# Patient Record
Sex: Female | Born: 1935 | Race: Black or African American | Hispanic: No | Marital: Married
Health system: Southern US, Community
[De-identification: ages and names within clinical notes are randomized; demographics above are authoritative.]

## PROBLEM LIST (undated history)

## (undated) DIAGNOSIS — E039 Hypothyroidism, unspecified: Secondary | ICD-10-CM

## (undated) DIAGNOSIS — I10 Essential (primary) hypertension: Secondary | ICD-10-CM

## (undated) DIAGNOSIS — C801 Malignant (primary) neoplasm, unspecified: Secondary | ICD-10-CM

## (undated) DIAGNOSIS — K219 Gastro-esophageal reflux disease without esophagitis: Secondary | ICD-10-CM

## (undated) HISTORY — PX: CHOLECYSTECTOMY: SHX55

## (undated) HISTORY — DX: Hypothyroidism, unspecified: E03.9

## (undated) HISTORY — DX: Essential (primary) hypertension: I10

## (undated) HISTORY — PX: HERNIA REPAIR: SHX51

## (undated) HISTORY — PX: KNEE SURGERY: SHX244

## (undated) HISTORY — DX: Gastro-esophageal reflux disease without esophagitis: K21.9

---

## 2006-05-11 ENCOUNTER — Encounter: Payer: Self-pay | Admitting: Orthopedic Surgery

## 2006-05-17 ENCOUNTER — Encounter: Payer: Self-pay | Admitting: Orthopedic Surgery

## 2006-06-16 ENCOUNTER — Encounter: Payer: Self-pay | Admitting: Orthopedic Surgery

## 2008-12-24 ENCOUNTER — Encounter: Payer: Self-pay | Admitting: Orthopedic Surgery

## 2009-01-15 ENCOUNTER — Encounter: Payer: Self-pay | Admitting: Orthopedic Surgery

## 2010-11-14 DIAGNOSIS — E039 Hypothyroidism, unspecified: Secondary | ICD-10-CM | POA: Insufficient documentation

## 2010-11-14 DIAGNOSIS — K449 Diaphragmatic hernia without obstruction or gangrene: Secondary | ICD-10-CM | POA: Insufficient documentation

## 2010-11-14 DIAGNOSIS — E669 Obesity, unspecified: Secondary | ICD-10-CM | POA: Insufficient documentation

## 2010-11-14 DIAGNOSIS — I10 Essential (primary) hypertension: Secondary | ICD-10-CM | POA: Insufficient documentation

## 2010-11-14 DIAGNOSIS — K219 Gastro-esophageal reflux disease without esophagitis: Secondary | ICD-10-CM | POA: Insufficient documentation

## 2010-11-24 ENCOUNTER — Ambulatory Visit: Payer: Self-pay | Admitting: Oncology

## 2010-11-25 ENCOUNTER — Ambulatory Visit: Payer: Self-pay | Admitting: Oncology

## 2011-08-01 DIAGNOSIS — S02411A LeFort I fracture, initial encounter for closed fracture: Secondary | ICD-10-CM | POA: Insufficient documentation

## 2012-01-25 ENCOUNTER — Ambulatory Visit: Payer: Self-pay | Admitting: Oncology

## 2012-02-21 ENCOUNTER — Ambulatory Visit: Payer: Self-pay | Admitting: Oncology

## 2012-02-22 LAB — COMPREHENSIVE METABOLIC PANEL
Alkaline Phosphatase: 89 U/L (ref 50–136)
Bilirubin,Total: 1.6 mg/dL — ABNORMAL HIGH (ref 0.2–1.0)
Calcium, Total: 9.7 mg/dL (ref 8.5–10.1)
Chloride: 102 mmol/L (ref 98–107)
Co2: 33 mmol/L — ABNORMAL HIGH (ref 21–32)
EGFR (African American): >60
EGFR (Non-African Amer.): 53 — ABNORMAL LOW
Glucose: 79 mg/dL (ref 65–99)
Potassium: 3.4 mmol/L — ABNORMAL LOW (ref 3.5–5.1)
SGOT(AST): 17 U/L (ref 15–37)
SGPT (ALT): 14 U/L (ref 12–78)
Sodium: 139 mmol/L (ref 136–145)
Total Protein: 7.9 g/dL (ref 6.4–8.2)

## 2012-02-22 LAB — CBC CANCER CENTER
Basophil #: 0 x10 3/mm (ref 0.0–0.1)
Basophil %: 0.3 %
HCT: 38.5 % (ref 35.0–47.0)
HGB: 13.6 g/dL (ref 12.0–16.0)
Lymphocyte #: 1.4 x10 3/mm (ref 1.0–3.6)
MCH: 31.4 pg (ref 26.0–34.0)
MCHC: 35.3 g/dL (ref 32.0–36.0)
MCV: 89 fL (ref 80–100)
Monocyte #: 0.4 x10 3/mm (ref 0.2–0.9)
Monocyte %: 9.6 %
Neutrophil #: 2.6 x10 3/mm (ref 1.4–6.5)
Neutrophil %: 56.7 %
Platelet: 110 x10 3/mm — ABNORMAL LOW (ref 150–440)
RBC: 4.32 10*6/uL (ref 3.80–5.20)
RDW: 16.3 % — ABNORMAL HIGH (ref 11.5–14.5)
WBC: 4.6 x10 3/mm (ref 3.6–11.0)

## 2012-02-22 LAB — MAGNESIUM: Magnesium: 2.1 mg/dL

## 2012-03-18 ENCOUNTER — Ambulatory Visit: Payer: Self-pay | Admitting: Oncology

## 2012-04-15 ENCOUNTER — Ambulatory Visit: Payer: Self-pay | Admitting: Oncology

## 2012-09-06 ENCOUNTER — Ambulatory Visit: Payer: Self-pay | Admitting: Oncology

## 2012-10-05 ENCOUNTER — Ambulatory Visit: Payer: Self-pay | Admitting: Oncology

## 2012-10-06 LAB — COMPREHENSIVE METABOLIC PANEL
Anion Gap: 8 (ref 7–16)
BUN: 7 mg/dL (ref 7–18)
Bilirubin,Total: 1.9 mg/dL — ABNORMAL HIGH (ref 0.2–1.0)
Calcium, Total: 10 mg/dL (ref 8.5–10.1)
Creatinine: 0.81 mg/dL (ref 0.60–1.30)
EGFR (African American): >60
EGFR (Non-African Amer.): >60
Glucose: 118 mg/dL — ABNORMAL HIGH (ref 65–99)
Osmolality: 277 (ref 275–301)
Potassium: 3 mmol/L — ABNORMAL LOW (ref 3.5–5.1)
SGOT(AST): 12 U/L — ABNORMAL LOW (ref 15–37)
Sodium: 139 mmol/L (ref 136–145)

## 2012-10-06 LAB — CBC CANCER CENTER
Basophil #: 0 x10 3/mm (ref 0.0–0.1)
Eosinophil %: 0.7 %
HCT: 36.6 % (ref 35.0–47.0)
HGB: 13 g/dL (ref 12.0–16.0)
Lymphocyte %: 20 %
MCH: 30.9 pg (ref 26.0–34.0)
Monocyte #: 0.1 x10 3/mm — ABNORMAL LOW (ref 0.2–0.9)
Neutrophil %: 75.9 %
RBC: 4.2 10*6/uL (ref 3.80–5.20)
RDW: 17.2 % — ABNORMAL HIGH (ref 11.5–14.5)
WBC: 2.9 x10 3/mm — ABNORMAL LOW (ref 3.6–11.0)

## 2012-10-16 ENCOUNTER — Ambulatory Visit: Payer: Self-pay | Admitting: Oncology

## 2012-11-15 ENCOUNTER — Ambulatory Visit: Payer: Self-pay | Admitting: Oncology

## 2012-12-16 ENCOUNTER — Ambulatory Visit: Payer: Self-pay | Admitting: Oncology

## 2013-01-15 ENCOUNTER — Ambulatory Visit: Payer: Self-pay | Admitting: Oncology

## 2013-02-15 ENCOUNTER — Ambulatory Visit: Payer: Self-pay | Admitting: Oncology

## 2013-02-26 LAB — BASIC METABOLIC PANEL
ANION GAP: 10 (ref 7–16)
BUN: 14 mg/dL (ref 7–18)
CALCIUM: 10 mg/dL (ref 8.5–10.1)
CREATININE: 0.9 mg/dL (ref 0.60–1.30)
Chloride: 101 mmol/L (ref 98–107)
Co2: 29 mmol/L (ref 21–32)
Glucose: 159 mg/dL — ABNORMAL HIGH (ref 65–99)
OSMOLALITY: 283 (ref 275–301)
POTASSIUM: 3.2 mmol/L — AB (ref 3.5–5.1)
Sodium: 140 mmol/L (ref 136–145)

## 2013-02-26 LAB — CBC CANCER CENTER
BASOS PCT: 0.4 %
Basophil #: 0 x10 3/mm (ref 0.0–0.1)
EOS PCT: 0 %
Eosinophil #: 0 x10 3/mm (ref 0.0–0.7)
HCT: 41.1 % (ref 35.0–47.0)
HGB: 13.4 g/dL (ref 12.0–16.0)
Lymphocyte #: 0.6 x10 3/mm — ABNORMAL LOW (ref 1.0–3.6)
Lymphocyte %: 20.1 %
MCH: 28.5 pg (ref 26.0–34.0)
MCHC: 32.5 g/dL (ref 32.0–36.0)
MCV: 88 fL (ref 80–100)
MONO ABS: 0 x10 3/mm — AB (ref 0.2–0.9)
MONOS PCT: 1.3 %
NEUTROS ABS: 2.5 x10 3/mm (ref 1.4–6.5)
NEUTROS PCT: 78.2 %
Platelet: 107 x10 3/mm — ABNORMAL LOW (ref 150–440)
RBC: 4.69 10*6/uL (ref 3.80–5.20)
RDW: 16.6 % — ABNORMAL HIGH (ref 11.5–14.5)
WBC: 3.2 x10 3/mm — ABNORMAL LOW (ref 3.6–11.0)

## 2013-03-18 ENCOUNTER — Ambulatory Visit: Payer: Self-pay

## 2013-03-18 ENCOUNTER — Ambulatory Visit: Payer: Self-pay | Admitting: Oncology

## 2013-04-18 ENCOUNTER — Ambulatory Visit: Payer: Self-pay | Admitting: Oncology

## 2013-05-16 ENCOUNTER — Ambulatory Visit: Payer: Self-pay | Admitting: Oncology

## 2013-06-15 ENCOUNTER — Ambulatory Visit: Payer: Self-pay | Admitting: Oncology

## 2013-07-04 ENCOUNTER — Ambulatory Visit: Payer: Self-pay | Admitting: Oncology

## 2013-08-03 ENCOUNTER — Ambulatory Visit: Payer: Self-pay | Admitting: Oncology

## 2013-08-15 ENCOUNTER — Ambulatory Visit: Payer: Self-pay | Admitting: Family Medicine

## 2013-08-15 ENCOUNTER — Ambulatory Visit: Payer: Self-pay | Admitting: Oncology

## 2013-09-04 LAB — CBC CANCER CENTER
BASOS ABS: 0 x10 3/mm (ref 0.0–0.1)
Basophil %: 0.4 %
EOS ABS: 0 x10 3/mm (ref 0.0–0.7)
Eosinophil %: 0.1 %
HCT: 42.2 % (ref 35.0–47.0)
HGB: 13.7 g/dL (ref 12.0–16.0)
LYMPHS PCT: 23.1 %
Lymphocyte #: 0.6 x10 3/mm — ABNORMAL LOW (ref 1.0–3.6)
MCH: 29.2 pg (ref 26.0–34.0)
MCHC: 32.4 g/dL (ref 32.0–36.0)
MCV: 90 fL (ref 80–100)
MONO ABS: 0 x10 3/mm — AB (ref 0.2–0.9)
Monocyte %: 1.6 %
NEUTROS ABS: 1.9 x10 3/mm (ref 1.4–6.5)
NEUTROS PCT: 74.8 %
Platelet: 86 x10 3/mm — ABNORMAL LOW (ref 150–440)
RBC: 4.69 10*6/uL (ref 3.80–5.20)
RDW: 17 % — ABNORMAL HIGH (ref 11.5–14.5)
WBC: 2.6 x10 3/mm — AB (ref 3.6–11.0)

## 2013-09-04 LAB — COMPREHENSIVE METABOLIC PANEL
ALT: 10 U/L — AB (ref 12–78)
ANION GAP: 7 (ref 7–16)
Albumin: 3.8 g/dL (ref 3.4–5.0)
Alkaline Phosphatase: 67 U/L
BUN: 9 mg/dL (ref 7–18)
Bilirubin,Total: 1.8 mg/dL — ABNORMAL HIGH (ref 0.2–1.0)
Calcium, Total: 9.9 mg/dL (ref 8.5–10.1)
Chloride: 103 mmol/L (ref 98–107)
Co2: 32 mmol/L (ref 21–32)
Creatinine: 0.95 mg/dL (ref 0.60–1.30)
EGFR (Non-African Amer.): 57 — ABNORMAL LOW
Glucose: 155 mg/dL — ABNORMAL HIGH (ref 65–99)
Osmolality: 285 (ref 275–301)
POTASSIUM: 3.6 mmol/L (ref 3.5–5.1)
SGOT(AST): 11 U/L — ABNORMAL LOW (ref 15–37)
Sodium: 142 mmol/L (ref 136–145)
TOTAL PROTEIN: 8.2 g/dL (ref 6.4–8.2)

## 2013-09-15 ENCOUNTER — Ambulatory Visit: Payer: Self-pay | Admitting: Family Medicine

## 2013-09-15 ENCOUNTER — Ambulatory Visit: Payer: Self-pay | Admitting: Oncology

## 2013-10-16 ENCOUNTER — Ambulatory Visit: Payer: Self-pay | Admitting: Oncology

## 2013-11-15 ENCOUNTER — Ambulatory Visit: Payer: Self-pay | Admitting: Oncology

## 2013-12-16 ENCOUNTER — Ambulatory Visit: Payer: Self-pay | Admitting: Oncology

## 2014-03-15 ENCOUNTER — Ambulatory Visit: Payer: Self-pay | Admitting: Oncology

## 2014-03-15 LAB — CBC CANCER CENTER
BASOS ABS: 0 x10 3/mm (ref 0.0–0.1)
Basophil %: 0.4 %
Eosinophil #: 0.1 x10 3/mm (ref 0.0–0.7)
Eosinophil %: 2.2 %
HCT: 35.7 % (ref 35.0–47.0)
HGB: 11.7 g/dL — AB (ref 12.0–16.0)
LYMPHS PCT: 39.1 %
Lymphocyte #: 1.4 x10 3/mm (ref 1.0–3.6)
MCH: 31.6 pg (ref 26.0–34.0)
MCHC: 32.8 g/dL (ref 32.0–36.0)
MCV: 96 fL (ref 80–100)
MONOS PCT: 13.3 %
Monocyte #: 0.5 x10 3/mm (ref 0.2–0.9)
Neutrophil #: 1.6 x10 3/mm (ref 1.4–6.5)
Neutrophil %: 45 %
Platelet: 135 x10 3/mm — ABNORMAL LOW (ref 150–440)
RBC: 3.71 10*6/uL — ABNORMAL LOW (ref 3.80–5.20)
RDW: 17.1 % — AB (ref 11.5–14.5)
WBC: 3.6 x10 3/mm (ref 3.6–11.0)

## 2014-03-15 LAB — BASIC METABOLIC PANEL
Anion Gap: 8 (ref 7–16)
BUN: 9 mg/dL (ref 7–18)
Calcium, Total: 9.5 mg/dL (ref 8.5–10.1)
Chloride: 106 mmol/L (ref 98–107)
Co2: 28 mmol/L (ref 21–32)
Creatinine: 0.94 mg/dL (ref 0.60–1.30)
GLUCOSE: 96 mg/dL (ref 65–99)
OSMOLALITY: 282 (ref 275–301)
Potassium: 3.8 mmol/L (ref 3.5–5.1)
SODIUM: 142 mmol/L (ref 136–145)

## 2014-03-18 ENCOUNTER — Ambulatory Visit: Payer: Self-pay | Admitting: Oncology

## 2014-04-16 ENCOUNTER — Ambulatory Visit
Admit: 2014-04-16 | Disposition: A | Discharge status: Home / Self Care | Payer: Self-pay | Attending: Oncology | Admitting: Oncology

## 2014-05-24 ENCOUNTER — Ambulatory Visit
Admit: 2014-05-24 | Disposition: A | Discharge status: Home / Self Care | Payer: Self-pay | Attending: Oncology | Admitting: Oncology

## 2014-05-31 ENCOUNTER — Other Ambulatory Visit: Payer: Self-pay | Admitting: Oncology

## 2014-05-31 DIAGNOSIS — D3A00 Benign carcinoid tumor of unspecified site: Secondary | ICD-10-CM

## 2014-06-21 ENCOUNTER — Inpatient Hospital Stay: Payer: Medicare Other | Attending: Oncology

## 2014-06-21 VITALS — BP 132/72 | HR 84 | Temp 98.0°F

## 2014-06-21 DIAGNOSIS — Z79899 Other long term (current) drug therapy: Secondary | ICD-10-CM | POA: Diagnosis not present

## 2014-06-21 DIAGNOSIS — C7B09 Secondary carcinoid tumors of other sites: Secondary | ICD-10-CM | POA: Insufficient documentation

## 2014-06-21 DIAGNOSIS — C7A00 Malignant carcinoid tumor of unspecified site: Secondary | ICD-10-CM | POA: Insufficient documentation

## 2014-06-21 DIAGNOSIS — C7B02 Secondary carcinoid tumors of liver: Secondary | ICD-10-CM | POA: Diagnosis not present

## 2014-06-21 MED ORDER — OCTREOTIDE ACETATE 20 MG IM KIT
20.0000 mg | PACK | INTRAMUSCULAR | Status: DC
  Administered 2014-06-21: 20 mg via INTRAMUSCULAR
  Filled 2014-06-21 (×2): qty 1

## 2014-06-21 MED ORDER — OCTREOTIDE ACETATE 30 MG IM KIT: 30.0000 mg | PACK | Freq: Once | INTRAMUSCULAR | Status: DC

## 2014-07-19 ENCOUNTER — Inpatient Hospital Stay: Payer: Medicare Other | Attending: Oncology

## 2014-07-19 DIAGNOSIS — Z79899 Other long term (current) drug therapy: Secondary | ICD-10-CM | POA: Insufficient documentation

## 2014-07-19 DIAGNOSIS — C7A00 Malignant carcinoid tumor of unspecified site: Secondary | ICD-10-CM

## 2014-07-19 DIAGNOSIS — C7B02 Secondary carcinoid tumors of liver: Secondary | ICD-10-CM | POA: Diagnosis present

## 2014-07-19 MED ORDER — OCTREOTIDE ACETATE 30 MG IM KIT
30.0000 mg | PACK | Freq: Once | INTRAMUSCULAR | Status: AC
  Administered 2014-07-19: 30 mg via INTRAMUSCULAR
  Filled 2014-07-19: qty 1

## 2014-08-16 ENCOUNTER — Inpatient Hospital Stay: Payer: Medicare Other | Attending: Oncology

## 2014-08-16 DIAGNOSIS — C7B02 Secondary carcinoid tumors of liver: Secondary | ICD-10-CM | POA: Insufficient documentation

## 2014-08-16 DIAGNOSIS — Z79899 Other long term (current) drug therapy: Secondary | ICD-10-CM | POA: Insufficient documentation

## 2014-08-16 DIAGNOSIS — C7A00 Malignant carcinoid tumor of unspecified site: Secondary | ICD-10-CM

## 2014-08-16 MED ORDER — OCTREOTIDE ACETATE 30 MG IM KIT
30.0000 mg | PACK | Freq: Once | INTRAMUSCULAR | Status: AC
  Administered 2014-08-16: 30 mg via INTRAMUSCULAR
  Filled 2014-08-16: qty 1

## 2014-09-13 ENCOUNTER — Inpatient Hospital Stay: Payer: Medicare Other

## 2014-09-20 ENCOUNTER — Other Ambulatory Visit: Payer: Self-pay | Admitting: Oncology

## 2014-09-24 ENCOUNTER — Other Ambulatory Visit: Payer: Self-pay | Admitting: Oncology

## 2014-09-24 DIAGNOSIS — C7A00 Malignant carcinoid tumor of unspecified site: Secondary | ICD-10-CM

## 2014-09-25 ENCOUNTER — Telehealth: Payer: Self-pay | Admitting: Oncology

## 2014-09-25 NOTE — Telephone Encounter (Signed)
I will fax prescription to patients pharmacy.

## 2014-09-25 NOTE — Telephone Encounter (Signed)
She has an allergy to IV dye and is getting a CT on 10/08/14. The instructions we received from CT were for her to take: 50mg  prednisone tablet, 13 hrs prior, 7 hrs prior, and 1 hr prior, and 50 mg Benadryl 1 hr prior for allergy to IV dye. We were also told to ask Woodfin Ganja to please write the prescriptions for her to get this. Thanks!

## 2014-09-27 ENCOUNTER — Other Ambulatory Visit: Payer: Self-pay

## 2014-09-27 ENCOUNTER — Ambulatory Visit: Payer: Self-pay | Admitting: Oncology

## 2014-10-08 ENCOUNTER — Inpatient Hospital Stay: Payer: Medicare Other | Attending: Oncology

## 2014-10-08 ENCOUNTER — Ambulatory Visit
Admission: RE | Admit: 2014-10-08 | Discharge: 2014-10-08 | Disposition: A | Discharge status: Home / Self Care | Payer: Medicare Other | Source: Ambulatory Visit | Attending: Oncology | Admitting: Oncology

## 2014-10-08 ENCOUNTER — Encounter: Payer: Medicare Other | Admitting: Family Medicine

## 2014-10-08 ENCOUNTER — Other Ambulatory Visit: Payer: Self-pay | Admitting: Family Medicine

## 2014-10-08 ENCOUNTER — Inpatient Hospital Stay: Payer: Medicare Other

## 2014-10-08 ENCOUNTER — Inpatient Hospital Stay: Payer: Medicare Other | Admitting: Family Medicine

## 2014-10-08 VITALS — BP 138/62 | HR 50 | Temp 96.3°F | Resp 20

## 2014-10-08 VITALS — BP 158/70 | HR 54 | Temp 96.3°F

## 2014-10-08 DIAGNOSIS — C7A00 Malignant carcinoid tumor of unspecified site: Secondary | ICD-10-CM

## 2014-10-08 DIAGNOSIS — Z79899 Other long term (current) drug therapy: Secondary | ICD-10-CM | POA: Insufficient documentation

## 2014-10-08 DIAGNOSIS — C7B09 Secondary carcinoid tumors of other sites: Secondary | ICD-10-CM | POA: Diagnosis not present

## 2014-10-08 DIAGNOSIS — C7B02 Secondary carcinoid tumors of liver: Secondary | ICD-10-CM | POA: Insufficient documentation

## 2014-10-08 LAB — CBC WITH DIFFERENTIAL/PLATELET
BASOS ABS: 0 10*3/uL (ref 0–0.1)
Eosinophils Absolute: 0.1 10*3/uL (ref 0–0.7)
HCT: 36.9 % (ref 35.0–47.0)
Hemoglobin: 12.3 g/dL (ref 12.0–16.0)
Lymphs Abs: 1.4 10*3/uL (ref 1.0–3.6)
MCH: 29.9 pg (ref 26.0–34.0)
MCHC: 33.4 g/dL (ref 32.0–36.0)
MCV: 89.6 fL (ref 80.0–100.0)
Monocytes Absolute: 0.4 10*3/uL (ref 0.2–0.9)
Monocytes Relative: 12 %
NEUTROS ABS: 1.4 10*3/uL (ref 1.4–6.5)
Neutrophils Relative %: 42 %
PLATELETS: 72 10*3/uL — AB (ref 150–440)
RBC: 4.11 MIL/uL (ref 3.80–5.20)
RDW: 17.5 % — ABNORMAL HIGH (ref 11.5–14.5)
WBC: 3.3 10*3/uL — AB (ref 3.6–11.0)

## 2014-10-08 LAB — COMPREHENSIVE METABOLIC PANEL WITH GFR
ALT: 8 U/L — ABNORMAL LOW (ref 14–54)
AST: 16 U/L (ref 15–41)
Albumin: 3.8 g/dL (ref 3.5–5.0)
Alkaline Phosphatase: 67 U/L (ref 38–126)
Anion gap: 7 (ref 5–15)
BUN: 8 mg/dL (ref 6–20)
CO2: 31 mmol/L (ref 22–32)
Calcium: 9.8 mg/dL (ref 8.9–10.3)
Chloride: 106 mmol/L (ref 101–111)
Creatinine, Ser: 0.73 mg/dL (ref 0.44–1.00)
GFR calc Af Amer: >60 mL/min
GFR calc non Af Amer: >60 mL/min
Glucose, Bld: 91 mg/dL (ref 65–99)
Potassium: 4.2 mmol/L (ref 3.5–5.1)
Sodium: 144 mmol/L (ref 135–145)
Total Bilirubin: 2.3 mg/dL — ABNORMAL HIGH (ref 0.3–1.2)
Total Protein: 7.2 g/dL (ref 6.5–8.1)

## 2014-10-08 MED ORDER — OCTREOTIDE ACETATE 30 MG IM KIT
30.0000 mg | PACK | Freq: Once | INTRAMUSCULAR | Status: AC
  Administered 2014-10-08: 30 mg via INTRAMUSCULAR

## 2014-10-08 MED ORDER — OCTREOTIDE ACETATE 30 MG IM KIT: 30.0000 mg | PACK | Freq: Once | INTRAMUSCULAR | Status: DC

## 2014-10-08 NOTE — Progress Notes (Signed)
Patient acute add on today for c/o dizziness.  Here initially as lab only.  Patient poor historian.  Unable to reconcile medication list. Magda Paganini, NP to evaluate once lab results are in.

## 2014-10-08 NOTE — Progress Notes (Signed)
Pt came to [facility] for her CT scan however pt never picked up contrast nor did she take the benadryl or prednisone so CT has been rescheduled. MD appt cancelled for this Friday. Pt missed her last sandostatin injection on July 29th. Decision made to give her 30mg  Octreotide injection today since she is currently in [place]. Pt states she is doing fairly well. Having some mild dizziness today. BP was good today. Pt is in a W/C. Friendly and talkative.

## 2014-10-09 ENCOUNTER — Other Ambulatory Visit: Payer: Medicare Other

## 2014-10-11 ENCOUNTER — Ambulatory Visit: Payer: Medicare Other | Admitting: Oncology

## 2014-10-11 ENCOUNTER — Other Ambulatory Visit: Payer: Medicare Other

## 2014-10-11 ENCOUNTER — Inpatient Hospital Stay: Payer: Medicare Other

## 2014-10-14 ENCOUNTER — Ambulatory Visit: Payer: Medicare Other | Admitting: Oncology

## 2014-10-14 ENCOUNTER — Other Ambulatory Visit: Payer: Medicare Other

## 2014-10-15 ENCOUNTER — Ambulatory Visit: Payer: Medicare Other

## 2014-10-15 ENCOUNTER — Ambulatory Visit
Admission: RE | Admit: 2014-10-15 | Discharge: 2014-10-15 | Disposition: A | Discharge status: Home / Self Care | Payer: Medicare Other | Source: Ambulatory Visit | Attending: Oncology | Admitting: Oncology

## 2014-10-15 DIAGNOSIS — C7A00 Malignant carcinoid tumor of unspecified site: Secondary | ICD-10-CM | POA: Diagnosis not present

## 2014-10-15 DIAGNOSIS — N200 Calculus of kidney: Secondary | ICD-10-CM | POA: Insufficient documentation

## 2014-10-15 DIAGNOSIS — K868 Other specified diseases of pancreas: Secondary | ICD-10-CM | POA: Insufficient documentation

## 2014-10-15 DIAGNOSIS — N281 Cyst of kidney, acquired: Secondary | ICD-10-CM | POA: Diagnosis not present

## 2014-10-15 MED ORDER — IOHEXOL 300 MG/ML  SOLN
100.0000 mL | Freq: Once | INTRAMUSCULAR | Status: AC | PRN
Start: 2014-10-15 — End: 2014-10-15
  Administered 2014-10-15: 100 mL via INTRAVENOUS

## 2014-10-18 ENCOUNTER — Inpatient Hospital Stay: Payer: Medicare Other | Attending: Oncology | Admitting: Oncology

## 2014-10-18 ENCOUNTER — Ambulatory Visit: Payer: Medicare Other

## 2014-10-18 ENCOUNTER — Inpatient Hospital Stay: Payer: Medicare Other

## 2014-10-18 ENCOUNTER — Encounter: Payer: Self-pay | Admitting: Oncology

## 2014-10-18 VITALS — BP 134/58 | HR 54 | Temp 96.1°F | Resp 16

## 2014-10-18 DIAGNOSIS — R5381 Other malaise: Secondary | ICD-10-CM | POA: Diagnosis not present

## 2014-10-18 DIAGNOSIS — R531 Weakness: Secondary | ICD-10-CM

## 2014-10-18 DIAGNOSIS — C7A00 Malignant carcinoid tumor of unspecified site: Secondary | ICD-10-CM | POA: Diagnosis not present

## 2014-10-18 DIAGNOSIS — K219 Gastro-esophageal reflux disease without esophagitis: Secondary | ICD-10-CM | POA: Insufficient documentation

## 2014-10-18 DIAGNOSIS — C7B02 Secondary carcinoid tumors of liver: Secondary | ICD-10-CM | POA: Diagnosis present

## 2014-10-18 DIAGNOSIS — R5383 Other fatigue: Secondary | ICD-10-CM | POA: Diagnosis not present

## 2014-10-18 DIAGNOSIS — E039 Hypothyroidism, unspecified: Secondary | ICD-10-CM | POA: Insufficient documentation

## 2014-10-18 DIAGNOSIS — R197 Diarrhea, unspecified: Secondary | ICD-10-CM

## 2014-10-18 DIAGNOSIS — C7B09 Secondary carcinoid tumors of other sites: Secondary | ICD-10-CM | POA: Diagnosis not present

## 2014-10-18 DIAGNOSIS — I10 Essential (primary) hypertension: Secondary | ICD-10-CM | POA: Insufficient documentation

## 2014-10-18 DIAGNOSIS — Z79899 Other long term (current) drug therapy: Secondary | ICD-10-CM

## 2014-10-21 NOTE — Progress Notes (Signed)
[place]  Telephone:(336) [phone] Fax:(336) [phone]  ID: Valerie Gray OB: [birth year]  MR#: [id number]  NLG#:[id number]  Patient Care Team: Renata Caprice, DO as PCP - General (Family Medicine)  CHIEF COMPLAINT: Metastatic carcinoid tumor with lesions in stomach and liver diagnosed in approximately October 2012  Chief Complaint  Patient presents with  . Follow-up    Malignant carcinoid tumor of unknown primary site/discuss test results    INTERVAL HISTORY: Patient returns to clinic today for further evaluation and discussion of her imaging results. She is complaining of increased diarrhea over the past several weeks. She continues to feel weak and fatigued. She does not complain of weight loss today. She also continues to have occasional abdominal pain. She denies any nausea, vomiting, or constipation. She denies any melena or hematochezia.  She has no neurologic complaints.  She denies any chest pain or shortness of breath.  Patient offers no further specific complaints.  REVIEW OF SYSTEMS:   Review of Systems  Constitutional: Positive for malaise/fatigue. Negative for weight loss.  Respiratory: Negative.   Cardiovascular: Negative.   Gastrointestinal: Positive for diarrhea.  Neurological: Positive for weakness.    As per HPI. Otherwise, a complete review of systems is negatve.  PAST MEDICAL HISTORY: Past Medical History  Diagnosis Date  . Hypothyroidism   . GERD (gastroesophageal reflux disease)   . Hypertension     PAST SURGICAL HISTORY: Past Surgical History  Procedure Laterality Date  . Cholecystectomy    . Hernia repair    . Knee surgery      FAMILY HISTORY No family history on file.     ADVANCED DIRECTIVES:    HEALTH MAINTENANCE: Social History  Substance Use Topics  . Smoking status: Not on file  . Smokeless tobacco: Not on file  . Alcohol Use: Not on file     Colonoscopy:  PAP:  Bone density:  Lipid panel:  Allergies    Allergen Reactions  . Iodinated Diagnostic Agents Hives, Itching and Rash    Current Outpatient Prescriptions  Medication Sig Dispense Refill  . cloNIDine (CATAPRES - DOSED IN MG/24 HR) 0.2 mg/24hr patch Place 0.2 mg onto the skin once a week.    . donepezil (ARICEPT) 10 MG tablet Take 10 mg by mouth at bedtime.    . furosemide (LASIX) 20 MG tablet Take 20 mg by mouth daily.    Marland Kitchen guaiFENesin 200 MG tablet Take 200 mg by mouth every 8 (eight) hours as needed for cough or to loosen phlegm.    Marland Kitchen levothyroxine (SYNTHROID, LEVOTHROID) 137 MCG tablet Take 137 mcg by mouth daily before breakfast.    . lisinopril (PRINIVIL,ZESTRIL) 10 MG tablet Take 10 mg by mouth daily.    Marland Kitchen loperamide (IMODIUM A-D) 2 MG tablet Take 2 mg by mouth 4 (four) times daily as needed for diarrhea or loose stools. 2 tabs after loose stools.  Max 3 doses in 24 hours    . metoprolol succinate (TOPROL-XL) 25 MG 24 hr tablet Take 25 mg by mouth 2 (two) times daily.    . potassium chloride (K-DUR) 10 MEQ tablet Take 10 mEq by mouth daily.    . potassium chloride SA (K-DUR,KLOR-CON) 20 MEQ tablet Take by mouth.     No current facility-administered medications for this visit.   Facility-Administered Medications Ordered in Other Visits  Medication Dose Route Frequency Provider Last Rate Last Dose  . octreotide (SANDOSTATIN LAR) IM injection 30 mg  30 mg Intramuscular Once Evlyn Kanner, NP  OBJECTIVE: Filed Vitals:   10/18/14 1110  BP: 134/58  Pulse: 54  Temp: 96.1 F (35.6 C)  Resp: 16     There is no height or weight on file to calculate BMI.    ECOG FS:1 - Symptomatic but completely ambulatory  General: Well-developed, well-nourished, no acute distress. Eyes: Pink conjunctiva, anicteric sclera. Lungs: Clear to auscultation bilaterally. Heart: Regular rate and rhythm. No rubs, murmurs, or gallops. Abdomen: Soft, nontender, nondistended. No organomegaly noted, normoactive bowel sounds. Musculoskeletal: No  edema, cyanosis, or clubbing. Neuro: Alert, answering all questions appropriately. Cranial nerves grossly intact. Skin: No rashes or petechiae noted. Psych: Normal affect.   LAB RESULTS:  Lab Results  Component Value Date   NA 144 10/08/2014   K 4.2 10/08/2014   CL 106 10/08/2014   CO2 31 10/08/2014   GLUCOSE 91 10/08/2014   BUN 8 10/08/2014   CREATININE 0.73 10/08/2014   CALCIUM 9.8 10/08/2014   PROT 7.2 10/08/2014   ALBUMIN 3.8 10/08/2014   AST 16 10/08/2014   ALT 8* 10/08/2014   ALKPHOS 67 10/08/2014   BILITOT 2.3* 10/08/2014   GFRNONAA >60 10/08/2014   GFRAA >60 10/08/2014    Lab Results  Component Value Date   WBC 3.3* 10/08/2014   NEUTROABS 1.4 10/08/2014   HGB 12.3 10/08/2014   HCT 36.9 10/08/2014   MCV 89.6 10/08/2014   PLT 72* 10/08/2014     STUDIES: Ct Abdomen Pelvis W Contrast  10/15/2014   CLINICAL DATA:  Restaging metastatic carcinoid tumor.  EXAM: CT ABDOMEN AND PELVIS WITH CONTRAST  TECHNIQUE: Multidetector CT imaging of the abdomen and pelvis was performed using the standard protocol following bolus administration of intravenous contrast.  CONTRAST:  130mL OMNIPAQUE IOHEXOL 300 MG/ML  SOLN  COMPARISON:  CT scan 09/04/2013  FINDINGS: Lower chest: The lung bases are grossly clear. Stable patchy areas of ground-glass opacity and interstitial thickening likely reflecting interstitial lung disease. No focal airspace consolidation, worrisome pulmonary nodule or pleural effusion. The heart is mildly enlarged but stable. Dense mitral valve annular calcifications are noted along with coronary artery calcifications. The distal esophagus is grossly normal.  Hepatobiliary: No focal hepatic lesions or intrahepatic biliary dilatation. Stable common bile duct dilatation likely due to prior cholecystectomy.  Pancreas: Stable mild pancreatic ductal prominence likely due to the patient's age. No mass or inflammation.  Spleen: Normal size.  No focal lesions.  Adrenals/Urinary  Tract: The adrenal glands are normal in stable.  There are bilateral renal calculi and bilateral renal cysts. No worrisome renal lesions or hydronephrosis.  Stomach/Bowel: The stomach, duodenum, small bowel and colon are unremarkable. No inflammatory changes, mass lesions or obstructive findings. The partially calcified mass in the gastrohepatic ligament is stable measuring 4.1 x 4.2 Cm. No surrounding adenopathy.  Vascular/Lymphatic: The aorta is normal in caliber. Stable atherosclerotic calcifications. No dissection. The branch vessels are patent. No mesenteric or retroperitoneal lymphadenopathy. I do not see any peritoneal surface disease or omental disease. Stable surgical changes involving the anterior abdominal wall and right upper pelvic musculature.  Other: Moderate bladder wall thickening likely due to lack of distention. I do not see a discrete mass. The ovaries appear normal and stable. No pelvic mass or adenopathy. No free pelvic fluid collections. No inguinal mass or adenopathy.  Musculoskeletal: No significant bony findings. Stable L2 compression fracture and osteoporosis.  IMPRESSION: 1. Stable CT examination of the abdomen/pelvis. The partially calcified gastrohepatic ligament mass is unchanged. No findings for metastatic disease. There were few questionable  liver lesions on the prior CT scan but I do not see any definite liver lesions today. 2. Stable mild biliary and pancreatic ductal dilatation. 3. Stable renal cysts and renal calculi.   Electronically Signed   By: Marijo Sanes M.D.   On: 10/15/2014 11:38    ASSESSMENT: Metastatic carcinoid tumor with lesions in stomach and liver, unknown if high-grade or low-grade.  PLAN:    1.  Carcinoid: CT scan results reviewed independently and reported as above with stable disease. Continue with 20 mg IM octreotide every 4 weeks for 20 mg IM octreotide.  She will then return to clinic in 6 months with repeat imaging and further evaluation.  2.  Diarrhea: Unlikely related to underlying carcinoid, will get chromogranin A as well as 24-hour 5 HIAA urine for further evaluation. Continue treatment with Imodium as prescribed.  Patient expressed understanding and was in agreement with this plan. She also understands that She can call clinic at any time with any questions, concerns, or complaints.   No matching staging information was found for the patient.  Lloyd Huger, MD   10/21/2014 1:26 PM

## 2014-10-22 ENCOUNTER — Inpatient Hospital Stay: Payer: Medicare Other

## 2014-10-22 DIAGNOSIS — C7A00 Malignant carcinoid tumor of unspecified site: Secondary | ICD-10-CM | POA: Diagnosis not present

## 2014-10-23 LAB — CHROMOGRANIN A: CHROMOGRANIN A: 4 nmol/L (ref 0–5)

## 2014-10-24 LAB — 5 HIAA, QUANTITATIVE, URINE, 24 HOUR
5 HIAA UR: 0.9 mg/L
5-HIAA,Quant.,24 Hr Urine: 1.1 mg/24 hr (ref 0.0–14.9)
TOTAL VOLUME: 1200

## 2014-11-08 ENCOUNTER — Inpatient Hospital Stay: Payer: Medicare Other

## 2014-11-08 VITALS — BP 125/60 | HR 49 | Temp 96.8°F

## 2014-11-08 DIAGNOSIS — C7A00 Malignant carcinoid tumor of unspecified site: Secondary | ICD-10-CM

## 2014-11-08 MED ORDER — OCTREOTIDE ACETATE 20 MG IM KIT
20.0000 mg | PACK | Freq: Once | INTRAMUSCULAR | Status: AC
  Administered 2014-11-08: 20 mg via INTRAMUSCULAR

## 2014-11-12 ENCOUNTER — Other Ambulatory Visit: Payer: Self-pay | Admitting: Family Medicine

## 2014-11-12 NOTE — Progress Notes (Signed)
This encounter was created in error - please disregard.

## 2014-12-06 ENCOUNTER — Inpatient Hospital Stay: Payer: Medicare Other | Attending: Oncology

## 2014-12-06 VITALS — Resp 18

## 2014-12-06 DIAGNOSIS — Z79899 Other long term (current) drug therapy: Secondary | ICD-10-CM | POA: Insufficient documentation

## 2014-12-06 DIAGNOSIS — C7A00 Malignant carcinoid tumor of unspecified site: Secondary | ICD-10-CM | POA: Insufficient documentation

## 2014-12-06 DIAGNOSIS — C7B02 Secondary carcinoid tumors of liver: Secondary | ICD-10-CM | POA: Insufficient documentation

## 2014-12-06 DIAGNOSIS — C7B09 Secondary carcinoid tumors of other sites: Secondary | ICD-10-CM | POA: Insufficient documentation

## 2014-12-06 MED ORDER — OCTREOTIDE ACETATE 20 MG IM KIT
20.0000 mg | PACK | Freq: Once | INTRAMUSCULAR | Status: AC
  Administered 2014-12-06: 20 mg via INTRAMUSCULAR

## 2015-01-03 ENCOUNTER — Inpatient Hospital Stay: Payer: Medicare Other

## 2015-01-03 ENCOUNTER — Inpatient Hospital Stay: Payer: Medicare Other | Attending: Oncology | Admitting: Oncology

## 2015-01-03 ENCOUNTER — Other Ambulatory Visit: Payer: Medicare Other

## 2015-01-03 VITALS — BP 142/70 | HR 52 | Temp 97.2°F | Resp 16

## 2015-01-03 DIAGNOSIS — C7B09 Secondary carcinoid tumors of other sites: Secondary | ICD-10-CM | POA: Insufficient documentation

## 2015-01-03 DIAGNOSIS — Z79899 Other long term (current) drug therapy: Secondary | ICD-10-CM | POA: Insufficient documentation

## 2015-01-03 DIAGNOSIS — R197 Diarrhea, unspecified: Secondary | ICD-10-CM | POA: Diagnosis not present

## 2015-01-03 DIAGNOSIS — C7A00 Malignant carcinoid tumor of unspecified site: Secondary | ICD-10-CM

## 2015-01-03 DIAGNOSIS — D3A00 Benign carcinoid tumor of unspecified site: Secondary | ICD-10-CM

## 2015-01-03 DIAGNOSIS — K219 Gastro-esophageal reflux disease without esophagitis: Secondary | ICD-10-CM

## 2015-01-03 DIAGNOSIS — R1032 Left lower quadrant pain: Secondary | ICD-10-CM | POA: Diagnosis not present

## 2015-01-03 DIAGNOSIS — I10 Essential (primary) hypertension: Secondary | ICD-10-CM

## 2015-01-03 DIAGNOSIS — E039 Hypothyroidism, unspecified: Secondary | ICD-10-CM | POA: Diagnosis not present

## 2015-01-03 DIAGNOSIS — C7B02 Secondary carcinoid tumors of liver: Secondary | ICD-10-CM | POA: Diagnosis not present

## 2015-01-03 LAB — COMPREHENSIVE METABOLIC PANEL
ALT: 6 U/L — AB (ref 14–54)
AST: 15 U/L (ref 15–41)
Albumin: 3.6 g/dL (ref 3.5–5.0)
Alkaline Phosphatase: 65 U/L (ref 38–126)
Anion gap: 4 — ABNORMAL LOW (ref 5–15)
BILIRUBIN TOTAL: 1.8 mg/dL — AB (ref 0.3–1.2)
BUN: 7 mg/dL (ref 6–20)
CO2: 32 mmol/L (ref 22–32)
Calcium: 9.2 mg/dL (ref 8.9–10.3)
Chloride: 105 mmol/L (ref 101–111)
Creatinine, Ser: 0.67 mg/dL (ref 0.44–1.00)
GFR calc Af Amer: >60 mL/min (ref >60)
GLUCOSE: 100 mg/dL — AB (ref 65–99)
Potassium: 3.4 mmol/L — ABNORMAL LOW (ref 3.5–5.1)
Sodium: 141 mmol/L (ref 135–145)
Total Protein: 6.8 g/dL (ref 6.5–8.1)

## 2015-01-03 LAB — CBC WITH DIFFERENTIAL/PLATELET
BASOS ABS: 0 10*3/uL (ref 0–0.1)
Basophils Relative: 0 %
Eosinophils Absolute: 0.1 10*3/uL (ref 0–0.7)
Eosinophils Relative: 2 %
HEMATOCRIT: 35.7 % (ref 35.0–47.0)
Hemoglobin: 11.9 g/dL — ABNORMAL LOW (ref 12.0–16.0)
Lymphs Abs: 1.3 10*3/uL (ref 1.0–3.6)
MCH: 28.7 pg (ref 26.0–34.0)
MCHC: 33.3 g/dL (ref 32.0–36.0)
MCV: 86.2 fL (ref 80.0–100.0)
MONO ABS: 0.3 10*3/uL (ref 0.2–0.9)
Monocytes Relative: 11 %
NEUTROS ABS: 1.2 10*3/uL — AB (ref 1.4–6.5)
Neutrophils Relative %: 42 %
Platelets: 86 10*3/uL — ABNORMAL LOW (ref 150–440)
RBC: 4.14 MIL/uL (ref 3.80–5.20)
RDW: 16.7 % — ABNORMAL HIGH (ref 11.5–14.5)
WBC: 2.8 10*3/uL — ABNORMAL LOW (ref 3.6–11.0)

## 2015-01-03 MED ORDER — OCTREOTIDE ACETATE 20 MG IM KIT
20.0000 mg | PACK | Freq: Once | INTRAMUSCULAR | Status: AC
  Administered 2015-01-03: 20 mg via INTRAMUSCULAR
  Filled 2015-01-03: qty 1

## 2015-01-03 NOTE — Progress Notes (Signed)
Patient is having left side pain when she lays down at night.

## 2015-01-06 LAB — CHROMOGRANIN A: CHROMOGRANIN A: 3 nmol/L (ref 0–5)

## 2015-01-18 NOTE — Progress Notes (Signed)
[place]  Telephone:(336) [phone] Fax:(336) [phone]  ID: Valerie Gray OB: Jan 30, 1931  MR#: [id number]  [id number]  Patient Care Team: Renata Caprice, DO as PCP - General (Family Medicine)  CHIEF COMPLAINT: Metastatic carcinoid tumor with lesions in stomach and liver diagnosed in approximately October 2012  Chief Complaint  Patient presents with  . carinoid tumor    INTERVAL HISTORY: Patient returns to clinic today for further evaluation and continuation of Sandostatin. She has occasional left-sided flank pain particularly when she lays down at night. She continues to have diarrhea. She does not complain of weakness or fatigue today. She does not complain of weight loss today. She denies any nausea, vomiting, or constipation. She denies any melena or hematochezia.  She has no neurologic complaints.  She denies any chest pain or shortness of breath.  Patient offers no further specific complaints.  REVIEW OF SYSTEMS:   Review of Systems  Constitutional: Negative for weight loss and malaise/fatigue.  Respiratory: Negative.   Cardiovascular: Negative.   Gastrointestinal: Positive for diarrhea.  Musculoskeletal: Negative.   Neurological: Negative.  Negative for weakness.    As per HPI. Otherwise, a complete review of systems is negatve.  PAST MEDICAL HISTORY: Past Medical History  Diagnosis Date  . Hypothyroidism   . GERD (gastroesophageal reflux disease)   . Hypertension     PAST SURGICAL HISTORY: Past Surgical History  Procedure Laterality Date  . Cholecystectomy    . Hernia repair    . Knee surgery      FAMILY HISTORY: Reviewed and unchanged. No reported history of malignancy or chronic disease.     ADVANCED DIRECTIVES:    HEALTH MAINTENANCE: Social History  Substance Use Topics  . Smoking status: Not on file  . Smokeless tobacco: Not on file  . Alcohol Use: Not on file     Colonoscopy:  PAP:  Bone density:  Lipid  panel:  Allergies  Allergen Reactions  . Iodinated Diagnostic Agents Hives, Itching and Rash    Current Outpatient Prescriptions  Medication Sig Dispense Refill  . cloNIDine (CATAPRES - DOSED IN MG/24 HR) 0.2 mg/24hr patch Place 0.2 mg onto the skin once a week.    . donepezil (ARICEPT) 10 MG tablet Take 10 mg by mouth at bedtime.    . furosemide (LASIX) 20 MG tablet Take 20 mg by mouth daily.    Marland Kitchen guaiFENesin 200 MG tablet Take 200 mg by mouth every 8 (eight) hours as needed for cough or to loosen phlegm.    Marland Kitchen levothyroxine (SYNTHROID, LEVOTHROID) 137 MCG tablet Take 137 mcg by mouth daily before breakfast.    . lisinopril (PRINIVIL,ZESTRIL) 10 MG tablet Take 10 mg by mouth daily.    Marland Kitchen loperamide (IMODIUM A-D) 2 MG tablet Take 2 mg by mouth 4 (four) times daily as needed for diarrhea or loose stools. 2 tabs after loose stools.  Max 3 doses in 24 hours    . metoprolol succinate (TOPROL-XL) 25 MG 24 hr tablet Take 25 mg by mouth 2 (two) times daily.    . potassium chloride (K-DUR) 10 MEQ tablet Take 10 mEq by mouth daily.    . potassium chloride SA (K-DUR,KLOR-CON) 20 MEQ tablet Take by mouth.     No current facility-administered medications for this visit.    OBJECTIVE: Filed Vitals:   01/03/15 0945  BP: 142/70  Pulse: 52  Temp: 97.2 F (36.2 C)  Resp: 16     There is no height or weight on file to  calculate BMI.    ECOG FS:1 - Symptomatic but completely ambulatory  General: Well-developed, well-nourished, no acute distress. Eyes: Pink conjunctiva, anicteric sclera. Lungs: Clear to auscultation bilaterally. Heart: Regular rate and rhythm. No rubs, murmurs, or gallops. Abdomen: Soft, nontender, nondistended. No organomegaly noted, normoactive bowel sounds. Musculoskeletal: No edema, cyanosis, or clubbing. Neuro: Alert, answering all questions appropriately. Cranial nerves grossly intact. Skin: No rashes or petechiae noted. Psych: Normal affect.   LAB RESULTS:  Lab Results   Component Value Date   NA 141 01/03/2015   K 3.4* 01/03/2015   CL 105 01/03/2015   CO2 32 01/03/2015   GLUCOSE 100* 01/03/2015   BUN 7 01/03/2015   CREATININE 0.67 01/03/2015   CALCIUM 9.2 01/03/2015   PROT 6.8 01/03/2015   ALBUMIN 3.6 01/03/2015   AST 15 01/03/2015   ALT 6* 01/03/2015   ALKPHOS 65 01/03/2015   BILITOT 1.8* 01/03/2015   GFRNONAA >60 01/03/2015   GFRAA >60 01/03/2015    Lab Results  Component Value Date   WBC 2.8* 01/03/2015   NEUTROABS 1.2* 01/03/2015   HGB 11.9* 01/03/2015   HCT 35.7 01/03/2015   MCV 86.2 01/03/2015   PLT 86* 01/03/2015     STUDIES: No results found.  ASSESSMENT: Metastatic carcinoid tumor with lesions in stomach and liver, unknown if high-grade or low-grade.  PLAN:    1.  Carcinoid: CT scan results from October 15, 2014 reviewed independently with stable disease. Continue with 20 mg IM octreotide every 4 weeks for 20 mg IM octreotide.  She will then return to clinic in 4 months with repeat imaging and further evaluation.  2. Diarrhea: Chromogranin A as well as 24-hour 5 HIAA urine are within normal limits. Continue treatment with Imodium as prescribed.  Patient expressed understanding and was in agreement with this plan. She also understands that She can call clinic at any time with any questions, concerns, or complaints.    Lloyd Huger, MD   01/18/2015 3:54 PM

## 2015-01-31 ENCOUNTER — Inpatient Hospital Stay: Payer: Medicare Other | Attending: Oncology

## 2015-02-28 ENCOUNTER — Inpatient Hospital Stay: Payer: Medicare Other | Attending: Oncology

## 2015-02-28 VITALS — Resp 20

## 2015-02-28 DIAGNOSIS — C7B02 Secondary carcinoid tumors of liver: Secondary | ICD-10-CM | POA: Insufficient documentation

## 2015-02-28 DIAGNOSIS — C7B09 Secondary carcinoid tumors of other sites: Secondary | ICD-10-CM | POA: Insufficient documentation

## 2015-02-28 DIAGNOSIS — Z79899 Other long term (current) drug therapy: Secondary | ICD-10-CM | POA: Insufficient documentation

## 2015-02-28 DIAGNOSIS — C7A00 Malignant carcinoid tumor of unspecified site: Secondary | ICD-10-CM | POA: Diagnosis present

## 2015-02-28 MED ORDER — OCTREOTIDE ACETATE 20 MG IM KIT
20.0000 mg | PACK | Freq: Once | INTRAMUSCULAR | Status: AC
  Administered 2015-02-28: 20 mg via INTRAMUSCULAR

## 2015-03-28 ENCOUNTER — Inpatient Hospital Stay: Payer: Medicare Other | Attending: Oncology

## 2015-03-28 DIAGNOSIS — C7B02 Secondary carcinoid tumors of liver: Secondary | ICD-10-CM | POA: Diagnosis not present

## 2015-03-28 DIAGNOSIS — C7A00 Malignant carcinoid tumor of unspecified site: Secondary | ICD-10-CM

## 2015-03-28 DIAGNOSIS — C7B09 Secondary carcinoid tumors of other sites: Secondary | ICD-10-CM | POA: Diagnosis not present

## 2015-03-28 DIAGNOSIS — Z79899 Other long term (current) drug therapy: Secondary | ICD-10-CM | POA: Diagnosis not present

## 2015-03-28 MED ORDER — OCTREOTIDE ACETATE 20 MG IM KIT
20.0000 mg | PACK | Freq: Once | INTRAMUSCULAR | Status: AC
  Administered 2015-03-28: 20 mg via INTRAMUSCULAR
  Filled 2015-03-28: qty 1

## 2015-04-23 ENCOUNTER — Other Ambulatory Visit: Payer: Self-pay | Admitting: Oncology

## 2015-04-23 ENCOUNTER — Inpatient Hospital Stay: Payer: Medicare Other | Attending: Oncology

## 2015-04-23 ENCOUNTER — Ambulatory Visit
Admission: RE | Admit: 2015-04-23 | Discharge: 2015-04-23 | Disposition: A | Discharge status: Home / Self Care | Payer: Medicare Other | Source: Ambulatory Visit | Attending: Oncology | Admitting: Oncology

## 2015-04-23 DIAGNOSIS — R5383 Other fatigue: Secondary | ICD-10-CM | POA: Insufficient documentation

## 2015-04-23 DIAGNOSIS — D3A00 Benign carcinoid tumor of unspecified site: Secondary | ICD-10-CM | POA: Insufficient documentation

## 2015-04-23 DIAGNOSIS — C7A092 Malignant carcinoid tumor of the stomach: Secondary | ICD-10-CM | POA: Diagnosis present

## 2015-04-23 DIAGNOSIS — I10 Essential (primary) hypertension: Secondary | ICD-10-CM | POA: Diagnosis not present

## 2015-04-23 DIAGNOSIS — R531 Weakness: Secondary | ICD-10-CM | POA: Insufficient documentation

## 2015-04-23 DIAGNOSIS — R5381 Other malaise: Secondary | ICD-10-CM | POA: Diagnosis not present

## 2015-04-23 DIAGNOSIS — E039 Hypothyroidism, unspecified: Secondary | ICD-10-CM | POA: Diagnosis not present

## 2015-04-23 DIAGNOSIS — N200 Calculus of kidney: Secondary | ICD-10-CM | POA: Insufficient documentation

## 2015-04-23 DIAGNOSIS — N281 Cyst of kidney, acquired: Secondary | ICD-10-CM | POA: Insufficient documentation

## 2015-04-23 DIAGNOSIS — C7A00 Malignant carcinoid tumor of unspecified site: Secondary | ICD-10-CM

## 2015-04-23 DIAGNOSIS — K219 Gastro-esophageal reflux disease without esophagitis: Secondary | ICD-10-CM | POA: Insufficient documentation

## 2015-04-23 DIAGNOSIS — R634 Abnormal weight loss: Secondary | ICD-10-CM | POA: Insufficient documentation

## 2015-04-23 DIAGNOSIS — R197 Diarrhea, unspecified: Secondary | ICD-10-CM | POA: Insufficient documentation

## 2015-04-23 DIAGNOSIS — K7689 Other specified diseases of liver: Secondary | ICD-10-CM | POA: Diagnosis not present

## 2015-04-23 DIAGNOSIS — Z79899 Other long term (current) drug therapy: Secondary | ICD-10-CM | POA: Insufficient documentation

## 2015-04-23 DIAGNOSIS — D696 Thrombocytopenia, unspecified: Secondary | ICD-10-CM | POA: Insufficient documentation

## 2015-04-23 DIAGNOSIS — C7B02 Secondary carcinoid tumors of liver: Secondary | ICD-10-CM | POA: Insufficient documentation

## 2015-04-23 LAB — CBC WITH DIFFERENTIAL/PLATELET
Basophils Absolute: 0 10*3/uL (ref 0–0.1)
Basophils Relative: 1 %
EOS ABS: 0 10*3/uL (ref 0–0.7)
HCT: 40.5 % (ref 35.0–47.0)
Hemoglobin: 13.3 g/dL (ref 12.0–16.0)
LYMPHS ABS: 0.7 10*3/uL — AB (ref 1.0–3.6)
Lymphocytes Relative: 24 %
MCH: 28.6 pg (ref 26.0–34.0)
MCHC: 32.9 g/dL (ref 32.0–36.0)
MCV: 87 fL (ref 80.0–100.0)
MONO ABS: 0 10*3/uL — AB (ref 0.2–0.9)
Neutro Abs: 2.1 10*3/uL (ref 1.4–6.5)
Neutrophils Relative %: 74 %
PLATELETS: 89 10*3/uL — AB (ref 150–440)
RBC: 4.66 MIL/uL (ref 3.80–5.20)
RDW: 17.5 % — AB (ref 11.5–14.5)
WBC: 2.9 10*3/uL — AB (ref 3.6–11.0)

## 2015-04-23 LAB — COMPREHENSIVE METABOLIC PANEL
ALT: 7 U/L — ABNORMAL LOW (ref 14–54)
ANION GAP: 3 — AB (ref 5–15)
AST: 17 U/L (ref 15–41)
Albumin: 3.9 g/dL (ref 3.5–5.0)
Alkaline Phosphatase: 71 U/L (ref 38–126)
BUN: 9 mg/dL (ref 6–20)
CHLORIDE: 105 mmol/L (ref 101–111)
CO2: 32 mmol/L (ref 22–32)
Calcium: 9.7 mg/dL (ref 8.9–10.3)
Creatinine, Ser: 0.6 mg/dL (ref 0.44–1.00)
Glucose, Bld: 139 mg/dL — ABNORMAL HIGH (ref 65–99)
Potassium: 4.3 mmol/L (ref 3.5–5.1)
SODIUM: 140 mmol/L (ref 135–145)
Total Bilirubin: 1.2 mg/dL (ref 0.3–1.2)
Total Protein: 7.8 g/dL (ref 6.5–8.1)

## 2015-04-23 MED ORDER — IOHEXOL 300 MG/ML  SOLN
150.0000 mL | Freq: Once | INTRAMUSCULAR | Status: AC | PRN
  Administered 2015-04-23: 100 mL via INTRAVENOUS

## 2015-04-25 ENCOUNTER — Inpatient Hospital Stay (HOSPITAL_BASED_OUTPATIENT_CLINIC_OR_DEPARTMENT_OTHER): Payer: Medicare Other | Admitting: Oncology

## 2015-04-25 ENCOUNTER — Inpatient Hospital Stay: Payer: Medicare Other

## 2015-04-25 VITALS — BP 146/69 | HR 59 | Temp 97.7°F | Wt 177.7 lb

## 2015-04-25 DIAGNOSIS — C7A00 Malignant carcinoid tumor of unspecified site: Secondary | ICD-10-CM

## 2015-04-25 DIAGNOSIS — I10 Essential (primary) hypertension: Secondary | ICD-10-CM

## 2015-04-25 DIAGNOSIS — K219 Gastro-esophageal reflux disease without esophagitis: Secondary | ICD-10-CM

## 2015-04-25 DIAGNOSIS — D696 Thrombocytopenia, unspecified: Secondary | ICD-10-CM | POA: Diagnosis not present

## 2015-04-25 DIAGNOSIS — E039 Hypothyroidism, unspecified: Secondary | ICD-10-CM

## 2015-04-25 DIAGNOSIS — R531 Weakness: Secondary | ICD-10-CM

## 2015-04-25 DIAGNOSIS — C7A092 Malignant carcinoid tumor of the stomach: Secondary | ICD-10-CM | POA: Diagnosis not present

## 2015-04-25 DIAGNOSIS — R197 Diarrhea, unspecified: Secondary | ICD-10-CM

## 2015-04-25 DIAGNOSIS — R5381 Other malaise: Secondary | ICD-10-CM

## 2015-04-25 DIAGNOSIS — C7B02 Secondary carcinoid tumors of liver: Secondary | ICD-10-CM | POA: Diagnosis not present

## 2015-04-25 DIAGNOSIS — R634 Abnormal weight loss: Secondary | ICD-10-CM

## 2015-04-25 DIAGNOSIS — R5383 Other fatigue: Secondary | ICD-10-CM

## 2015-04-25 DIAGNOSIS — Z79899 Other long term (current) drug therapy: Secondary | ICD-10-CM

## 2015-04-25 LAB — CHROMOGRANIN A: Chromogranin A: 3 nmol/L (ref 0–5)

## 2015-04-25 MED ORDER — OCTREOTIDE ACETATE 20 MG IM KIT
20.0000 mg | PACK | Freq: Once | INTRAMUSCULAR | Status: AC
  Administered 2015-04-25: 20 mg via INTRAMUSCULAR
  Filled 2015-04-25: qty 1

## 2015-04-25 NOTE — Progress Notes (Signed)
Patient states that she has recently lost weight.  Patient reports that she has loose stools soon after eating.

## 2015-05-03 NOTE — Progress Notes (Signed)
[name]  [phone] Fax:(336) [phone]  ID: Valerie Gray OB: [birth year]  MR#: [id number]  [id number]  Patient Care Team: Renata Caprice, DO as PCP - General (Family Medicine)  CHIEF COMPLAINT: Metastatic carcinoid tumor with lesions in stomach and liver diagnosed in approximately October 2012  Chief Complaint  Patient presents with  . carcinoid tumor    INTERVAL HISTORY: Patient returns to clinic today for further evaluation, discussion of her imaging results, and continuation of Sandostatin. She has some unintentional weight loss recently but otherwise has felt well. She does not complain of diarrhea, but does admit to loose stools. She continues to have weakness and fatigue. She has no neurologic complaints. She denies any nausea, vomiting, or constipation. She denies any melena or hematochezia. She denies any chest pain or shortness of breath.  Patient offers no further specific complaints.  REVIEW OF SYSTEMS:   Review of Systems  Constitutional: Positive for weight loss and malaise/fatigue.  Respiratory: Negative.   Cardiovascular: Negative.   Gastrointestinal: Positive for diarrhea.  Genitourinary: Negative.   Musculoskeletal: Negative.   Neurological: Positive for weakness.    As per HPI. Otherwise, a complete review of systems is negatve.  PAST MEDICAL HISTORY: Past Medical History  Diagnosis Date  . Hypothyroidism   . GERD (gastroesophageal reflux disease)   . Hypertension     PAST SURGICAL HISTORY: Past Surgical History  Procedure Laterality Date  . Cholecystectomy    . Hernia repair    . Knee surgery      FAMILY HISTORY: Reviewed and unchanged. No reported history of malignancy or chronic disease.     ADVANCED DIRECTIVES:    HEALTH MAINTENANCE: Social History  Substance Use Topics  . Smoking status: Not on file  . Smokeless tobacco: Not on file  . Alcohol Use: Not on file     Colonoscopy:  PAP:  Bone  density:  Lipid panel:  Allergies  Allergen Reactions  . Iodinated Diagnostic Agents Hives, Itching and Rash    Current Outpatient Prescriptions  Medication Sig Dispense Refill  . cloNIDine (CATAPRES - DOSED IN MG/24 HR) 0.2 mg/24hr patch Place 0.2 mg onto the skin once a week.    . donepezil (ARICEPT) 10 MG tablet Take 10 mg by mouth at bedtime.    . furosemide (LASIX) 20 MG tablet Take 20 mg by mouth daily.    Marland Kitchen guaiFENesin 200 MG tablet Take 200 mg by mouth every 8 (eight) hours as needed for cough or to loosen phlegm.    Marland Kitchen levothyroxine (SYNTHROID, LEVOTHROID) 137 MCG tablet Take 137 mcg by mouth daily before breakfast.    . lisinopril (PRINIVIL,ZESTRIL) 10 MG tablet Take 10 mg by mouth daily.    Marland Kitchen loperamide (IMODIUM A-D) 2 MG tablet Take 2 mg by mouth 4 (four) times daily as needed for diarrhea or loose stools. 2 tabs after loose stools.  Max 3 doses in 24 hours    . metoprolol succinate (TOPROL-XL) 25 MG 24 hr tablet Take 25 mg by mouth 2 (two) times daily.    . potassium chloride (K-DUR) 10 MEQ tablet Take 10 mEq by mouth daily.    . potassium chloride SA (K-DUR,KLOR-CON) 20 MEQ tablet Take by mouth.     No current facility-administered medications for this visit.    OBJECTIVE: Filed Vitals:   04/25/15 1118  BP: 146/69  Pulse: 59  Temp: 97.7 F (36.5 C)     There is no height on file to calculate BMI.  ECOG FS:1 - Symptomatic but completely ambulatory  General: Well-developed, well-nourished, no acute distress. Eyes: Pink conjunctiva, anicteric sclera. Lungs: Clear to auscultation bilaterally. Heart: Regular rate and rhythm. No rubs, murmurs, or gallops. Abdomen: Soft, nontender, nondistended. No organomegaly noted, normoactive bowel sounds. Musculoskeletal: No edema, cyanosis, or clubbing. Neuro: Alert, answering all questions appropriately. Cranial nerves grossly intact. Skin: No rashes or petechiae noted. Psych: Normal affect.   LAB RESULTS:  Lab Results    Component Value Date   NA 140 04/23/2015   K 4.3 04/23/2015   CL 105 04/23/2015   CO2 32 04/23/2015   GLUCOSE 139* 04/23/2015   BUN 9 04/23/2015   CREATININE 0.60 04/23/2015   CALCIUM 9.7 04/23/2015   PROT 7.8 04/23/2015   ALBUMIN 3.9 04/23/2015   AST 17 04/23/2015   ALT 7* 04/23/2015   ALKPHOS 71 04/23/2015   BILITOT 1.2 04/23/2015   GFRNONAA >60 04/23/2015   GFRAA >60 04/23/2015    Lab Results  Component Value Date   WBC 2.9* 04/23/2015   NEUTROABS 2.1 04/23/2015   HGB 13.3 04/23/2015   HCT 40.5 04/23/2015   MCV 87.0 04/23/2015   PLT 89* 04/23/2015     STUDIES: Ct Abdomen Pelvis W Contrast  04/23/2015  CLINICAL DATA:  Metastatic carcinoid tumor with stomach and liver lesions. EXAM: CT ABDOMEN AND PELVIS WITH CONTRAST TECHNIQUE: Multidetector CT imaging of the abdomen and pelvis was performed using the standard protocol following bolus administration of intravenous contrast. CONTRAST:  12mL OMNIPAQUE IOHEXOL 300 MG/ML  SOLN COMPARISON:  10/15/2014 FINDINGS: Lower chest: Stable appearance of patchy areas of ground-glass attenuation with interstitial prominence, likely reflecting underlying chronic changes. Hepatobiliary: No focal abnormality within the liver parenchyma. Gallbladder is surgically absent. Mild prominence of the extrahepatic common duct is unchanged and is probably related to cholecystectomy. Pancreas: Stable prominence main pancreatic duct without evidence for pancreatic head mass. Spleen: No splenomegaly. No focal mass lesion. Adrenals/Urinary Tract: No adrenal nodule or mass. Stable appearance dilated upper pole collecting system right kidney with 8 mm nonobstructing stone. Multiple cysts are seen in the kidneys bilaterally. No evidence for an enhancing renal lesion. 13 mm cyst in the interpolar left kidney has thin internal septation, compatible with Bosniak II lesion. No evidence for hydroureter. The urinary bladder appears normal for the degree of distention.  Stomach/Bowel: Stomach is nondistended. No gastric wall thickening. No evidence of outlet obstruction. Duodenum is normally positioned as is the ligament of Treitz. No small bowel wall thickening. No small bowel dilatation. The terminal ileum is normal. The appendix is normal. Diverticular changes are noted in the left colon without evidence of diverticulitis. Vascular/Lymphatic: There is abdominal aortic atherosclerosis without aneurysm. Calcified lymph node/ mass in the gastrohepatic ligament is unchanged. When measuring the lesion in the same dimensions in which was measured previously, it is 4.0 x 4.2 cm today compared to 4.1 x 4.2 cm previously. No evidence for lymphadenopathy in the hepatoduodenal ligament. No retroperitoneal lymphadenopathy. Reproductive: Uterus is surgically absent. There is no adnexal mass. Other: No intraperitoneal free fluid. Musculoskeletal: Stable L2 compression deformity. IMPRESSION: 1. Stable exam. No new or progressive findings. The partially calcified mass in the gastrohepatic ligament is stable. 2. Stable mild distention of the extrahepatic bile duct and main pancreatic duct. 3. Bilateral renal cysts including a Bosniak II lesion in the left kidney. 4. Nonobstructing right renal stone. Electronically Signed   By: Misty Stanley M.D.   On: 04/23/2015 14:07    ASSESSMENT: Metastatic carcinoid tumor with lesions  in stomach and liver, unknown if high-grade or low-grade.  PLAN:    1.  Carcinoid: CT scan results from April 23, 2015 reviewed independently with essentially stable disease. Patient's chromogranin A levels are within normal limits and stable at 3. Continue with 20 mg IM octreotide every 4 weeks.  She will then return to clinic in 6 months with repeat imaging and further evaluation.  2. Diarrhea: Chromogranin A as well as 24-hour 5 HIAA urine are within normal limits. Continue treatment with Imodium and octreotide as prescribed. 3. Weight loss: Mild, monitor. 4.  Thrombocytopenia: Unchanged, monitor.  Patient expressed understanding and was in agreement with this plan. She also understands that She can call clinic at any time with any questions, concerns, or complaints.    Lloyd Huger, MD   05/03/2015 12:07 PM

## 2015-05-23 ENCOUNTER — Inpatient Hospital Stay: Payer: Medicare Other

## 2015-05-26 ENCOUNTER — Ambulatory Visit: Payer: Medicare Other

## 2015-05-27 ENCOUNTER — Ambulatory Visit: Payer: Medicare Other

## 2015-05-29 ENCOUNTER — Inpatient Hospital Stay: Payer: Medicare Other | Attending: Oncology

## 2015-05-29 VITALS — BP 130/78 | HR 64 | Temp 98.1°F | Resp 19

## 2015-05-29 DIAGNOSIS — C7A092 Malignant carcinoid tumor of the stomach: Secondary | ICD-10-CM | POA: Diagnosis not present

## 2015-05-29 DIAGNOSIS — Z79899 Other long term (current) drug therapy: Secondary | ICD-10-CM | POA: Insufficient documentation

## 2015-05-29 DIAGNOSIS — C7A00 Malignant carcinoid tumor of unspecified site: Secondary | ICD-10-CM

## 2015-05-29 DIAGNOSIS — C7B02 Secondary carcinoid tumors of liver: Secondary | ICD-10-CM | POA: Insufficient documentation

## 2015-05-29 MED ORDER — OCTREOTIDE ACETATE 20 MG IM KIT
20.0000 mg | PACK | Freq: Once | INTRAMUSCULAR | Status: AC
  Administered 2015-05-29: 20 mg via INTRAMUSCULAR

## 2015-06-20 ENCOUNTER — Inpatient Hospital Stay: Payer: Medicare Other

## 2015-06-27 ENCOUNTER — Inpatient Hospital Stay: Payer: Medicare Other | Attending: Oncology

## 2015-06-27 VITALS — Resp 18

## 2015-06-27 DIAGNOSIS — C7A092 Malignant carcinoid tumor of the stomach: Secondary | ICD-10-CM | POA: Insufficient documentation

## 2015-06-27 DIAGNOSIS — C7B02 Secondary carcinoid tumors of liver: Secondary | ICD-10-CM | POA: Diagnosis not present

## 2015-06-27 DIAGNOSIS — C7A00 Malignant carcinoid tumor of unspecified site: Secondary | ICD-10-CM

## 2015-06-27 DIAGNOSIS — D696 Thrombocytopenia, unspecified: Secondary | ICD-10-CM | POA: Diagnosis not present

## 2015-06-27 MED ORDER — OCTREOTIDE ACETATE 20 MG IM KIT
20.0000 mg | PACK | Freq: Once | INTRAMUSCULAR | Status: AC
  Administered 2015-06-27: 20 mg via INTRAMUSCULAR

## 2015-07-18 ENCOUNTER — Inpatient Hospital Stay: Payer: Medicare Other

## 2015-08-01 ENCOUNTER — Inpatient Hospital Stay: Payer: Medicare Other | Attending: Oncology

## 2015-08-01 VITALS — BP 143/86 | HR 60 | Temp 97.2°F | Resp 18

## 2015-08-01 DIAGNOSIS — C7B02 Secondary carcinoid tumors of liver: Secondary | ICD-10-CM | POA: Diagnosis not present

## 2015-08-01 DIAGNOSIS — C7A092 Malignant carcinoid tumor of the stomach: Secondary | ICD-10-CM | POA: Diagnosis present

## 2015-08-01 DIAGNOSIS — C7A00 Malignant carcinoid tumor of unspecified site: Secondary | ICD-10-CM

## 2015-08-01 DIAGNOSIS — Z79899 Other long term (current) drug therapy: Secondary | ICD-10-CM | POA: Diagnosis not present

## 2015-08-01 MED ORDER — OCTREOTIDE ACETATE 20 MG IM KIT
20.0000 mg | PACK | Freq: Once | INTRAMUSCULAR | Status: AC
Start: 2015-08-01 — End: 2015-08-01
  Administered 2015-08-01: 20 mg via INTRAMUSCULAR

## 2015-08-15 ENCOUNTER — Inpatient Hospital Stay: Payer: Medicare Other

## 2015-08-29 ENCOUNTER — Inpatient Hospital Stay: Payer: Medicare Other | Attending: Oncology

## 2015-08-29 VITALS — BP 126/75 | HR 56 | Temp 96.4°F | Resp 18

## 2015-08-29 DIAGNOSIS — Z79899 Other long term (current) drug therapy: Secondary | ICD-10-CM | POA: Insufficient documentation

## 2015-08-29 DIAGNOSIS — C7A00 Malignant carcinoid tumor of unspecified site: Secondary | ICD-10-CM

## 2015-08-29 DIAGNOSIS — C7A092 Malignant carcinoid tumor of the stomach: Secondary | ICD-10-CM | POA: Diagnosis present

## 2015-08-29 MED ORDER — OCTREOTIDE ACETATE 20 MG IM KIT
20.0000 mg | PACK | Freq: Once | INTRAMUSCULAR | Status: AC
  Administered 2015-08-29: 20 mg via INTRAMUSCULAR

## 2015-09-12 ENCOUNTER — Inpatient Hospital Stay: Payer: Medicare Other

## 2015-09-26 ENCOUNTER — Inpatient Hospital Stay: Payer: Medicare Other

## 2015-09-30 ENCOUNTER — Inpatient Hospital Stay: Payer: Medicare Other | Attending: Oncology

## 2015-09-30 VITALS — BP 145/83 | HR 65 | Temp 96.3°F | Resp 18

## 2015-09-30 DIAGNOSIS — C7A00 Malignant carcinoid tumor of unspecified site: Secondary | ICD-10-CM

## 2015-09-30 DIAGNOSIS — C7A092 Malignant carcinoid tumor of the stomach: Secondary | ICD-10-CM | POA: Diagnosis present

## 2015-09-30 DIAGNOSIS — Z79899 Other long term (current) drug therapy: Secondary | ICD-10-CM | POA: Diagnosis not present

## 2015-09-30 MED ORDER — OCTREOTIDE ACETATE 20 MG IM KIT
20.0000 mg | PACK | Freq: Once | INTRAMUSCULAR | Status: AC
  Administered 2015-09-30: 20 mg via INTRAMUSCULAR

## 2015-10-10 ENCOUNTER — Inpatient Hospital Stay: Payer: Medicare Other

## 2015-10-16 ENCOUNTER — Telehealth: Payer: Self-pay | Admitting: *Deleted

## 2015-10-16 NOTE — Telephone Encounter (Signed)
Patient allergic to contrast, needs prep ordered and sent in

## 2015-10-16 NOTE — Telephone Encounter (Signed)
Prep orders faxed to patients residence at the [facility]. I spoke with patients nurse Jan, she will take care of getting RX filled for patient at their pharmacy.

## 2015-10-16 NOTE — Telephone Encounter (Signed)
Thank yoo.

## 2015-10-17 ENCOUNTER — Inpatient Hospital Stay: Payer: Medicare Other | Attending: Oncology

## 2015-10-17 ENCOUNTER — Ambulatory Visit
Admission: RE | Admit: 2015-10-17 | Discharge: 2015-10-17 | Disposition: A | Discharge status: Home / Self Care | Payer: Medicare Other | Source: Ambulatory Visit | Attending: Oncology | Admitting: Oncology

## 2015-10-17 ENCOUNTER — Ambulatory Visit: Payer: Medicare Other

## 2015-10-17 DIAGNOSIS — I700 Atherosclerosis of aorta: Secondary | ICD-10-CM | POA: Insufficient documentation

## 2015-10-17 DIAGNOSIS — R1900 Intra-abdominal and pelvic swelling, mass and lump, unspecified site: Secondary | ICD-10-CM | POA: Insufficient documentation

## 2015-10-17 DIAGNOSIS — N200 Calculus of kidney: Secondary | ICD-10-CM | POA: Insufficient documentation

## 2015-10-17 DIAGNOSIS — Z79899 Other long term (current) drug therapy: Secondary | ICD-10-CM | POA: Diagnosis not present

## 2015-10-17 DIAGNOSIS — N281 Cyst of kidney, acquired: Secondary | ICD-10-CM | POA: Insufficient documentation

## 2015-10-17 DIAGNOSIS — C7A00 Malignant carcinoid tumor of unspecified site: Secondary | ICD-10-CM | POA: Diagnosis present

## 2015-10-17 DIAGNOSIS — C7A092 Malignant carcinoid tumor of the stomach: Secondary | ICD-10-CM | POA: Diagnosis present

## 2015-10-17 LAB — CBC WITH DIFFERENTIAL/PLATELET
Basophils Absolute: 0 10*3/uL (ref 0–0.1)
EOS ABS: 0 10*3/uL (ref 0–0.7)
HCT: 40.6 % (ref 35.0–47.0)
HEMOGLOBIN: 13.3 g/dL (ref 12.0–16.0)
Lymphocytes Relative: 18 %
Lymphs Abs: 0.8 10*3/uL — ABNORMAL LOW (ref 1.0–3.6)
MCH: 29.4 pg (ref 26.0–34.0)
MCHC: 32.7 g/dL (ref 32.0–36.0)
MCV: 89.8 fL (ref 80.0–100.0)
Monocytes Absolute: 0.1 10*3/uL — ABNORMAL LOW (ref 0.2–0.9)
Neutro Abs: 3.3 10*3/uL (ref 1.4–6.5)
PLATELETS: 80 10*3/uL — AB (ref 150–440)
RBC: 4.52 MIL/uL (ref 3.80–5.20)
RDW: 18.4 % — ABNORMAL HIGH (ref 11.5–14.5)
WBC: 4.1 10*3/uL (ref 3.6–11.0)

## 2015-10-17 LAB — COMPREHENSIVE METABOLIC PANEL
ALBUMIN: 4.1 g/dL (ref 3.5–5.0)
ALK PHOS: 69 U/L (ref 38–126)
ALT: 7 U/L — ABNORMAL LOW (ref 14–54)
ANION GAP: 6 (ref 5–15)
AST: 14 U/L — ABNORMAL LOW (ref 15–41)
BUN: 11 mg/dL (ref 6–20)
CALCIUM: 9.7 mg/dL (ref 8.9–10.3)
CHLORIDE: 103 mmol/L (ref 101–111)
CO2: 33 mmol/L — AB (ref 22–32)
Creatinine, Ser: 0.64 mg/dL (ref 0.44–1.00)
GFR calc Af Amer: >60 mL/min (ref >60)
GFR calc non Af Amer: >60 mL/min (ref >60)
GLUCOSE: 142 mg/dL — AB (ref 65–99)
POTASSIUM: 4 mmol/L (ref 3.5–5.1)
SODIUM: 142 mmol/L (ref 135–145)
Total Bilirubin: 1.9 mg/dL — ABNORMAL HIGH (ref 0.3–1.2)
Total Protein: 7.6 g/dL (ref 6.5–8.1)

## 2015-10-17 MED ORDER — IOPAMIDOL (ISOVUE-300) INJECTION 61%
100.0000 mL | Freq: Once | INTRAVENOUS | Status: AC | PRN
  Administered 2015-10-17: 100 mL via INTRAVENOUS

## 2015-10-24 ENCOUNTER — Ambulatory Visit: Payer: Medicare Other

## 2015-10-24 ENCOUNTER — Ambulatory Visit: Payer: Medicare Other | Admitting: Oncology

## 2015-10-24 ENCOUNTER — Inpatient Hospital Stay: Payer: Medicare Other

## 2015-10-24 VITALS — BP 132/84 | HR 68 | Temp 97.0°F | Resp 18

## 2015-10-24 DIAGNOSIS — C7A00 Malignant carcinoid tumor of unspecified site: Secondary | ICD-10-CM | POA: Diagnosis not present

## 2015-10-24 MED ORDER — OCTREOTIDE ACETATE 20 MG IM KIT
20.0000 mg | PACK | Freq: Once | INTRAMUSCULAR | Status: AC
  Administered 2015-10-24: 20 mg via INTRAMUSCULAR

## 2015-11-07 ENCOUNTER — Ambulatory Visit: Payer: Medicare Other

## 2015-11-07 ENCOUNTER — Ambulatory Visit: Payer: Medicare Other | Admitting: Oncology

## 2015-11-19 NOTE — Progress Notes (Signed)
[facility]  [phone] [phone]  ID: Valerie Gray OB: 1930-05-31  MR#: [id number]  [id number]  Patient Care Team: Renata Caprice, DO as PCP - General (Family Medicine)  CHIEF COMPLAINT: Metastatic carcinoid tumor with lesions in stomach and liver diagnosed in approximately October 2012.  INTERVAL HISTORY: Patient returns to clinic today for further evaluation and discussion of her imaging results. Her dementia has significantly become worse over the past 6 months. Review of systems is difficult, but patient offers no complaints. She does not complain of diarrhea today. She continues to have weakness and fatigue. She has no neurologic complaints. She denies any nausea, vomiting, or constipation. She denies any melena or hematochezia. She denies any chest pain or shortness of breath.  Patient offers no further specific complaints.  REVIEW OF SYSTEMS:   Review of Systems  Constitutional: Positive for malaise/fatigue. Negative for weight loss.  Respiratory: Negative.  Negative for cough and shortness of breath.   Cardiovascular: Negative.  Negative for chest pain and leg swelling.  Gastrointestinal: Positive for diarrhea. Negative for abdominal pain.  Genitourinary: Negative.   Musculoskeletal: Negative.   Neurological: Positive for weakness.  Psychiatric/Behavioral: Positive for memory loss. The patient is not nervous/anxious.     As per HPI. Otherwise, a complete review of systems is negative.  PAST MEDICAL HISTORY: Past Medical History:  Diagnosis Date  . GERD (gastroesophageal reflux disease)   . Hypertension   . Hypothyroidism     PAST SURGICAL HISTORY: Past Surgical History:  Procedure Laterality Date  . CHOLECYSTECTOMY    . HERNIA REPAIR    . KNEE SURGERY      FAMILY HISTORY: Reviewed and unchanged. No reported history of malignancy or chronic disease.     ADVANCED DIRECTIVES:    HEALTH MAINTENANCE: Social History    Substance Use Topics  . Smoking status: Not on file  . Smokeless tobacco: Not on file  . Alcohol use Not on file     Colonoscopy:  PAP:  Bone density:  Lipid panel:  Allergies  Allergen Reactions  . Iodinated Diagnostic Agents Hives, Itching and Rash    Current Outpatient Prescriptions  Medication Sig Dispense Refill  . furosemide (LASIX) 20 MG tablet Take 20 mg by mouth daily.    Marland Kitchen levothyroxine (SYNTHROID, LEVOTHROID) 137 MCG tablet Take 137 mcg by mouth daily before breakfast.    . lisinopril (PRINIVIL,ZESTRIL) 10 MG tablet Take 10 mg by mouth daily.    Marland Kitchen loratadine (CLARITIN) 10 MG tablet Take 10 mg by mouth daily.    . metoprolol succinate (TOPROL-XL) 25 MG 24 hr tablet Take 25 mg by mouth 2 (two) times daily.    Marland Kitchen omeprazole (PRILOSEC) 20 MG capsule Take 20 mg by mouth daily.    . potassium chloride (K-DUR) 10 MEQ tablet Take 10 mEq by mouth daily.     No current facility-administered medications for this visit.     OBJECTIVE: Vitals:   11/21/15 1403  BP: (!) 145/83  Pulse: 63  Resp: 20  Temp: (!) 96 F (35.6 C)     There is no height or weight on file to calculate BMI.    ECOG FS:2 - Symptomatic, <50% confined to bed  General: Well-developed, well-nourished, no acute distress. Eyes: Pink conjunctiva, anicteric sclera. Lungs: Clear to auscultation bilaterally. Heart: Regular rate and rhythm. No rubs, murmurs, or gallops. Abdomen: Soft, nontender, nondistended. No organomegaly noted, normoactive bowel sounds. Musculoskeletal: No edema, cyanosis, or clubbing. Neuro: Alert, answering all questions appropriately.  Cranial nerves grossly intact. Skin: No rashes or petechiae noted. Psych: Normal affect.   LAB RESULTS:  Lab Results  Component Value Date   NA 142 10/17/2015   K 4.0 10/17/2015   CL 103 10/17/2015   CO2 33 (H) 10/17/2015   GLUCOSE 142 (H) 10/17/2015   BUN 11 10/17/2015   CREATININE 0.64 10/17/2015   CALCIUM 9.7 10/17/2015   PROT 7.6  10/17/2015   ALBUMIN 4.1 10/17/2015   AST 14 (L) 10/17/2015   ALT 7 (L) 10/17/2015   ALKPHOS 69 10/17/2015   BILITOT 1.9 (H) 10/17/2015   GFRNONAA >60 10/17/2015   GFRAA >60 10/17/2015    Lab Results  Component Value Date   WBC 4.1 10/17/2015   NEUTROABS 3.3 10/17/2015   HGB 13.3 10/17/2015   HCT 40.6 10/17/2015   MCV 89.8 10/17/2015   PLT 80 (L) 10/17/2015     STUDIES: No results found.  ASSESSMENT: Metastatic carcinoid tumor with lesions in stomach and liver, unknown if high-grade or low-grade.  PLAN:    1.  Metastatic carcinoid tumor with lesions in stomach and liver, unknown if high-grade or low-grade: CT scan results from October 17, 2015 reviewed independently with essentially stable disease. Patient's chromogranin A levels are within normal limits. Given the stability of patient's disease and her worsening dementia, will discontinue Sandostatin at this time. Return to clinic in 6 months for repeat imaging. If her disease continues to remain stable, patient can likely be discharged from clinic.  2. Diarrhea: Patient does not complain of this today. Chromogranin A as well as 24-hour 5 HIAA urine are within normal limits. Continue treatment with Imodium as needed and discontinue octreotide. 3. Weight loss: Mild, monitor. 4. Thrombocytopenia: Unchanged, monitor.  Patient expressed understanding and was in agreement with this plan. She also understands that She can call clinic at any time with any questions, concerns, or complaints.    Lloyd Huger, MD   11/26/2015 8:11 AM

## 2015-11-21 ENCOUNTER — Inpatient Hospital Stay: Payer: Medicare Other | Attending: Oncology | Admitting: Oncology

## 2015-11-21 ENCOUNTER — Inpatient Hospital Stay: Payer: Medicare Other

## 2015-11-21 VITALS — BP 145/83 | HR 63 | Temp 96.0°F | Resp 20

## 2015-11-21 DIAGNOSIS — E039 Hypothyroidism, unspecified: Secondary | ICD-10-CM

## 2015-11-21 DIAGNOSIS — F0390 Unspecified dementia without behavioral disturbance: Secondary | ICD-10-CM | POA: Diagnosis not present

## 2015-11-21 DIAGNOSIS — R5383 Other fatigue: Secondary | ICD-10-CM | POA: Diagnosis not present

## 2015-11-21 DIAGNOSIS — R531 Weakness: Secondary | ICD-10-CM | POA: Diagnosis not present

## 2015-11-21 DIAGNOSIS — Z79899 Other long term (current) drug therapy: Secondary | ICD-10-CM | POA: Insufficient documentation

## 2015-11-21 DIAGNOSIS — I10 Essential (primary) hypertension: Secondary | ICD-10-CM | POA: Diagnosis not present

## 2015-11-21 DIAGNOSIS — R634 Abnormal weight loss: Secondary | ICD-10-CM | POA: Insufficient documentation

## 2015-11-21 DIAGNOSIS — C7A00 Malignant carcinoid tumor of unspecified site: Secondary | ICD-10-CM | POA: Diagnosis not present

## 2015-11-21 DIAGNOSIS — D696 Thrombocytopenia, unspecified: Secondary | ICD-10-CM | POA: Diagnosis not present

## 2015-11-21 DIAGNOSIS — R197 Diarrhea, unspecified: Secondary | ICD-10-CM

## 2015-11-21 DIAGNOSIS — K219 Gastro-esophageal reflux disease without esophagitis: Secondary | ICD-10-CM | POA: Insufficient documentation

## 2015-11-21 DIAGNOSIS — C7B02 Secondary carcinoid tumors of liver: Secondary | ICD-10-CM | POA: Diagnosis not present

## 2015-11-21 DIAGNOSIS — C7A092 Malignant carcinoid tumor of the stomach: Secondary | ICD-10-CM | POA: Diagnosis present

## 2015-11-21 NOTE — Progress Notes (Signed)
Patient here today for follow up regarding carcinoid tumor. Patient denies any concerns today.

## 2015-12-19 ENCOUNTER — Inpatient Hospital Stay: Payer: Medicare Other

## 2016-01-16 ENCOUNTER — Inpatient Hospital Stay: Payer: Medicare Other

## 2016-02-13 ENCOUNTER — Inpatient Hospital Stay: Payer: Medicare Other

## 2016-03-12 ENCOUNTER — Inpatient Hospital Stay: Payer: Medicare Other

## 2016-05-13 ENCOUNTER — Other Ambulatory Visit: Payer: Self-pay

## 2016-05-13 DIAGNOSIS — C7A00 Malignant carcinoid tumor of unspecified site: Secondary | ICD-10-CM

## 2016-05-17 ENCOUNTER — Inpatient Hospital Stay: Payer: Medicare Other | Attending: Oncology

## 2016-05-17 ENCOUNTER — Ambulatory Visit
Admission: RE | Admit: 2016-05-17 | Discharge: 2016-05-17 | Disposition: A | Discharge status: Home / Self Care | Payer: Medicare Other | Source: Ambulatory Visit | Attending: Oncology | Admitting: Oncology

## 2016-05-17 DIAGNOSIS — K219 Gastro-esophageal reflux disease without esophagitis: Secondary | ICD-10-CM | POA: Insufficient documentation

## 2016-05-17 DIAGNOSIS — Z79899 Other long term (current) drug therapy: Secondary | ICD-10-CM | POA: Diagnosis not present

## 2016-05-17 DIAGNOSIS — C7B02 Secondary carcinoid tumors of liver: Secondary | ICD-10-CM | POA: Insufficient documentation

## 2016-05-17 DIAGNOSIS — I10 Essential (primary) hypertension: Secondary | ICD-10-CM | POA: Diagnosis not present

## 2016-05-17 DIAGNOSIS — R5381 Other malaise: Secondary | ICD-10-CM | POA: Insufficient documentation

## 2016-05-17 DIAGNOSIS — C7B09 Secondary carcinoid tumors of other sites: Secondary | ICD-10-CM | POA: Diagnosis not present

## 2016-05-17 DIAGNOSIS — R531 Weakness: Secondary | ICD-10-CM | POA: Insufficient documentation

## 2016-05-17 DIAGNOSIS — R5383 Other fatigue: Secondary | ICD-10-CM | POA: Insufficient documentation

## 2016-05-17 DIAGNOSIS — I700 Atherosclerosis of aorta: Secondary | ICD-10-CM | POA: Diagnosis not present

## 2016-05-17 DIAGNOSIS — C7A00 Malignant carcinoid tumor of unspecified site: Secondary | ICD-10-CM | POA: Insufficient documentation

## 2016-05-17 DIAGNOSIS — R634 Abnormal weight loss: Secondary | ICD-10-CM | POA: Diagnosis not present

## 2016-05-17 DIAGNOSIS — D696 Thrombocytopenia, unspecified: Secondary | ICD-10-CM | POA: Diagnosis not present

## 2016-05-17 DIAGNOSIS — E039 Hypothyroidism, unspecified: Secondary | ICD-10-CM | POA: Diagnosis not present

## 2016-05-17 DIAGNOSIS — F0390 Unspecified dementia without behavioral disturbance: Secondary | ICD-10-CM | POA: Insufficient documentation

## 2016-05-17 DIAGNOSIS — Z90710 Acquired absence of both cervix and uterus: Secondary | ICD-10-CM | POA: Insufficient documentation

## 2016-05-17 LAB — CBC WITH DIFFERENTIAL/PLATELET
BASOS PCT: 0 %
Basophils Absolute: 0 10*3/uL (ref 0–0.1)
Eosinophils Absolute: 0.1 10*3/uL (ref 0–0.7)
Eosinophils Relative: 2 %
HEMATOCRIT: 39.8 % (ref 35.0–47.0)
Hemoglobin: 13 g/dL (ref 12.0–16.0)
LYMPHS PCT: 40 %
Lymphs Abs: 1.3 10*3/uL (ref 1.0–3.6)
MCH: 28.9 pg (ref 26.0–34.0)
MCHC: 32.5 g/dL (ref 32.0–36.0)
MCV: 88.7 fL (ref 80.0–100.0)
MONO ABS: 0.4 10*3/uL (ref 0.2–0.9)
MONOS PCT: 13 %
NEUTROS ABS: 1.5 10*3/uL (ref 1.4–6.5)
Neutrophils Relative %: 45 %
Platelets: 84 10*3/uL — ABNORMAL LOW (ref 150–440)
RBC: 4.49 MIL/uL (ref 3.80–5.20)
RDW: 17.7 % — AB (ref 11.5–14.5)
WBC: 3.3 10*3/uL — ABNORMAL LOW (ref 3.6–11.0)

## 2016-05-17 LAB — COMPREHENSIVE METABOLIC PANEL
ALT: 8 U/L — ABNORMAL LOW (ref 14–54)
ANION GAP: 3 — AB (ref 5–15)
AST: 14 U/L — ABNORMAL LOW (ref 15–41)
Albumin: 3.9 g/dL (ref 3.5–5.0)
Alkaline Phosphatase: 67 U/L (ref 38–126)
BILIRUBIN TOTAL: 2 mg/dL — AB (ref 0.3–1.2)
BUN: 9 mg/dL (ref 6–20)
CO2: 31 mmol/L (ref 22–32)
Calcium: 9.4 mg/dL (ref 8.9–10.3)
Chloride: 104 mmol/L (ref 101–111)
Creatinine, Ser: 0.68 mg/dL (ref 0.44–1.00)
Glucose, Bld: 85 mg/dL (ref 65–99)
POTASSIUM: 3.7 mmol/L (ref 3.5–5.1)
Sodium: 138 mmol/L (ref 135–145)
Total Protein: 7.5 g/dL (ref 6.5–8.1)

## 2016-05-18 ENCOUNTER — Ambulatory Visit: Payer: Medicare Other

## 2016-05-18 ENCOUNTER — Telehealth: Payer: Self-pay | Admitting: *Deleted

## 2016-05-18 NOTE — Telephone Encounter (Signed)
Informed nurse at [facility] that patient should come to appointment as scheduled if she gets her PET Scan completed tomorrow.

## 2016-05-21 ENCOUNTER — Ambulatory Visit: Payer: Medicare Other | Admitting: Oncology

## 2016-06-03 ENCOUNTER — Ambulatory Visit
Admission: RE | Admit: 2016-06-03 | Discharge: 2016-06-03 | Disposition: A | Discharge status: Home / Self Care | Payer: Medicare Other | Source: Ambulatory Visit | Attending: Oncology | Admitting: Oncology

## 2016-06-03 DIAGNOSIS — I2510 Atherosclerotic heart disease of native coronary artery without angina pectoris: Secondary | ICD-10-CM | POA: Insufficient documentation

## 2016-06-03 DIAGNOSIS — I348 Other nonrheumatic mitral valve disorders: Secondary | ICD-10-CM | POA: Insufficient documentation

## 2016-06-03 DIAGNOSIS — I700 Atherosclerosis of aorta: Secondary | ICD-10-CM | POA: Diagnosis not present

## 2016-06-03 DIAGNOSIS — C7A00 Malignant carcinoid tumor of unspecified site: Secondary | ICD-10-CM | POA: Diagnosis present

## 2016-06-03 DIAGNOSIS — R1909 Other intra-abdominal and pelvic swelling, mass and lump: Secondary | ICD-10-CM | POA: Insufficient documentation

## 2016-06-03 DIAGNOSIS — I358 Other nonrheumatic aortic valve disorders: Secondary | ICD-10-CM | POA: Diagnosis not present

## 2016-06-03 DIAGNOSIS — N200 Calculus of kidney: Secondary | ICD-10-CM | POA: Insufficient documentation

## 2016-06-03 MED ORDER — IOPAMIDOL (ISOVUE-300) INJECTION 61%
100.0000 mL | Freq: Once | INTRAVENOUS | Status: AC | PRN
Start: 2016-06-03 — End: 2016-06-03
  Administered 2016-06-03: 100 mL via INTRAVENOUS

## 2016-06-03 NOTE — Progress Notes (Signed)
[place]  Telephone:(336) [phone] Fax:(336) [phone]  ID: Valerie Gray OB: 1930/03/31  MR#: [id number]  QIO#:[id number]  Patient Care Team: Renata Caprice, DO as PCP - General (Family Medicine)  CHIEF COMPLAINT: Metastatic carcinoid tumor with lesions in stomach and liver diagnosed in approximately October 2012.  INTERVAL HISTORY: Patient returns to clinic today for further evaluation and discussion of her imaging results. She continues to decline secondary to her ongoing dementia. Review of systems is difficult, but patient offers no complaints. She does not complain of diarrhea today. She continues to have weakness and fatigue. She denies any fevers.  She has no neurologic complaints. She denies any nausea, vomiting, or constipation. She denies any melena or hematochezia. She denies any chest pain or shortness of breath.  Patient offers no further specific complaints.  REVIEW OF SYSTEMS:   Review of Systems  Constitutional: Positive for malaise/fatigue. Negative for chills, diaphoresis, fever and weight loss.  Respiratory: Negative.  Negative for cough and shortness of breath.   Cardiovascular: Negative.  Negative for chest pain and leg swelling.  Gastrointestinal: Negative for abdominal pain and diarrhea.  Genitourinary: Negative.   Musculoskeletal: Negative.   Neurological: Positive for weakness.  Psychiatric/Behavioral: Positive for memory loss. The patient is not nervous/anxious.     As per HPI. Otherwise, a complete review of systems is negative.  PAST MEDICAL HISTORY: Past Medical History:  Diagnosis Date  . GERD (gastroesophageal reflux disease)   . Hypertension   . Hypothyroidism     PAST SURGICAL HISTORY: Past Surgical History:  Procedure Laterality Date  . CHOLECYSTECTOMY    . HERNIA REPAIR    . KNEE SURGERY      FAMILY HISTORY: Reviewed and unchanged. No reported history of malignancy or chronic disease.     ADVANCED DIRECTIVES:     HEALTH MAINTENANCE: Social History  Substance Use Topics  . Smoking status: Not on file  . Smokeless tobacco: Not on file  . Alcohol use Not on file     Colonoscopy:  PAP:  Bone density:  Lipid panel:  Allergies  Allergen Reactions  . Iodinated Diagnostic Agents Hives, Itching and Rash    Current Outpatient Prescriptions  Medication Sig Dispense Refill  . acetaminophen (TYLENOL) 325 MG tablet Take 650 mg by mouth every 6 (six) hours as needed.    . furosemide (LASIX) 20 MG tablet Take 20 mg by mouth daily.    Marland Kitchen levothyroxine (SYNTHROID, LEVOTHROID) 137 MCG tablet Take 125 mcg by mouth daily before breakfast.     . lisinopril (PRINIVIL,ZESTRIL) 10 MG tablet Take 10 mg by mouth daily.    Marland Kitchen loratadine (CLARITIN) 10 MG tablet Take 10 mg by mouth daily.    . potassium chloride (K-DUR) 10 MEQ tablet Take 10 mEq by mouth daily.    . ranitidine (ZANTAC) 150 MG tablet Take 150 mg by mouth 2 (two) times daily.    . sertraline (ZOLOFT) 50 MG tablet Take 50 mg by mouth daily.    . traMADol (ULTRAM) 50 MG tablet Take by mouth every 6 (six) hours as needed.    . metoprolol succinate (TOPROL-XL) 25 MG 24 hr tablet Take 25 mg by mouth 2 (two) times daily.    Marland Kitchen omeprazole (PRILOSEC) 20 MG capsule Take 20 mg by mouth daily.     No current facility-administered medications for this visit.     OBJECTIVE: Vitals:   06/04/16 1148  BP: 108/66  Pulse: 73  Resp: 18  Temp: 97.4 F (36.3 C)  There is no height or weight on file to calculate BMI.    ECOG FS:2 - Symptomatic, <50% confined to bed  General: Well-developed, well-nourished, no acute distress. Eyes: Pink conjunctiva, anicteric sclera. Lungs: Clear to auscultation bilaterally. Heart: Regular rate and rhythm. No rubs, murmurs, or gallops. Abdomen: Soft, nontender, nondistended. No organomegaly noted, normoactive bowel sounds. Musculoskeletal: No edema, cyanosis, or clubbing. Neuro: Alert, answering all questions  appropriately. Cranial nerves grossly intact. Skin: No rashes or petechiae noted. Psych: Normal affect.   LAB RESULTS:  Lab Results  Component Value Date   NA 138 05/17/2016   K 3.7 05/17/2016   CL 104 05/17/2016   CO2 31 05/17/2016   GLUCOSE 85 05/17/2016   BUN 9 05/17/2016   CREATININE 0.68 05/17/2016   CALCIUM 9.4 05/17/2016   PROT 7.5 05/17/2016   ALBUMIN 3.9 05/17/2016   AST 14 (L) 05/17/2016   ALT 8 (L) 05/17/2016   ALKPHOS 67 05/17/2016   BILITOT 2.0 (H) 05/17/2016   GFRNONAA >60 05/17/2016   GFRAA >60 05/17/2016    Lab Results  Component Value Date   WBC 3.3 (L) 05/17/2016   NEUTROABS 1.5 05/17/2016   HGB 13.0 05/17/2016   HCT 39.8 05/17/2016   MCV 88.7 05/17/2016   PLT 84 (L) 05/17/2016     STUDIES: Ct Abdomen Pelvis W Contrast  Result Date: 06/03/2016 CLINICAL DATA:  83 year old female with history of metastatic carcinoid tumor with lesions in the liver and stomach noted on prior examinations. EXAM: CT ABDOMEN AND PELVIS WITH CONTRAST TECHNIQUE: Multidetector CT imaging of the abdomen and pelvis was performed using the standard protocol following bolus administration of intravenous contrast. CONTRAST:  14mL ISOVUE-300 IOPAMIDOL (ISOVUE-300) INJECTION 61% COMPARISON:  Multiple priors, most recently CT the abdomen and pelvis 10/17/2015. FINDINGS: Lower chest: Cardiomegaly. Severe calcifications of the mitral valve and mitral annulus. Calcifications of the aortic valve (mild). Atherosclerotic calcifications in the left anterior descending, left circumflex and right coronary arteries. Hepatobiliary: No definite cystic or solid hepatic lesions. Specifically, no definite hypervascular hepatic lesion. No intra or extrahepatic biliary ductal dilatation. Status post cholecystectomy. Pancreas: No pancreatic mass. No pancreatic ductal dilatation. No pancreatic or peripancreatic fluid or inflammatory changes. Spleen: Unremarkable. Adrenals/Urinary Tract: Bilateral adrenal  glands are normal in appearance. Several nonobstructive calculi are present within the right renal collecting system measuring up to 9 mm in the interpolar region. No additional calculi are noted within the left renal collecting system, along the course of either ureter, or within the lumen of the urinary bladder. No hydroureteronephrosis to suggest urinary tract obstruction at this time. Multiple low-attenuation renal lesions are noted bilaterally, the largest of which are compatible with simple cysts, measuring up to 1.9 cm in diameter in the interpolar region of the left kidney. Several other smaller subcentimeter low-attenuation lesions in both kidneys are too small to characterize, but are statistically likely to represent tiny cysts and are similar to the prior examination. Urinary bladder is nearly decompressed, but otherwise unremarkable in appearance. Stomach/Bowel: The stomach is largely decompressed, and unremarkable in appearance. There is no pathologic dilatation of small bowel or colon. Normal appendix. Vascular/Lymphatic: Aortic atherosclerosis, without evidence of aneurysm or dissection noted in the abdominal or pelvic vasculature. Again noted is a large partially calcified mass in the region of the gastrohepatic ligament, which currently measures 5.2 x 4.1 cm, likely an enlarged lymph node. Overall, this appears slightly increased in size compared to the prior examination (previously 4.1 x 3.7 cm). No other lymphadenopathy is noted  elsewhere in the abdomen or pelvis. Reproductive: Status post hysterectomy. Ovaries are not confidently identified may be surgically absent or atrophic. Other: Postoperative changes of mesh repair for ventral hernia in the infraumbilical region. No significant volume of ascites. No pneumoperitoneum. Musculoskeletal: Old chronic compression fracture of L2 with approximately 60% loss of central vertebral body height is unchanged. There are no aggressive appearing lytic or  blastic lesions noted in the visualized portions of the skeleton. IMPRESSION: 1. Slight interval growth of a partially calcified nodal mass in the gastrohepatic ligament. The possibility of disease progression should be considered. 2. No other definite sites of metastatic disease are noted elsewhere in the abdomen or pelvis. 3. Aortic atherosclerosis, in addition to at least 3 vessel coronary artery disease. 4. There are calcifications of the aortic valve, mitral valve and mitral annulus. Echocardiographic correlation for evaluation of potential valvular dysfunction may be warranted if clinically indicated. 5. Multiple nonobstructive calculi are present within the collecting system of the right kidney, measuring up to 9 mm in the interpolar region. No ureteral stones or findings of urinary tract obstruction are noted at this time. 6. Additional incidental findings, as above. Electronically Signed   By: Vinnie Langton M.D.   On: 06/03/2016 14:53    ASSESSMENT: Metastatic carcinoid tumor with lesions in stomach and liver, unknown if high-grade or low-grade.  PLAN:    1.  Metastatic carcinoid tumor with lesions in stomach and liver, unknown if high-grade or low-grade: CT scan results from June 03, 2016 reviewed independently with slight progression of disease. Patient's chromogranin A levels are within normal limits. Given her worsening dementia, will continue to hold Sandostatin at this time. Return to clinic in 6 months for repeat imaging. If her disease continues to remain stable, patient can likely be discharged from clinic.  2. Diarrhea: Patient does not complain of this today. Chromogranin A as well as 24-hour 5 HIAA urine are within normal limits. Continue treatment with Imodium as needed and discontinue octreotide as above. 3. Weight loss: Mild, monitor. 4. Thrombocytopenia: Unchanged, monitor. 5. Hyperbilirubinemia: Unchanged. Unclear etiology, monitor.  Patient expressed understanding and was  in agreement with this plan. She also understands that She can call clinic at any time with any questions, concerns, or complaints.    Lloyd Huger, MD   06/05/2016 1:39 PM

## 2016-06-04 ENCOUNTER — Inpatient Hospital Stay (HOSPITAL_BASED_OUTPATIENT_CLINIC_OR_DEPARTMENT_OTHER): Payer: Medicare Other | Admitting: Oncology

## 2016-06-04 VITALS — BP 108/66 | HR 73 | Temp 97.4°F | Resp 18

## 2016-06-04 DIAGNOSIS — Z79899 Other long term (current) drug therapy: Secondary | ICD-10-CM

## 2016-06-04 DIAGNOSIS — E039 Hypothyroidism, unspecified: Secondary | ICD-10-CM

## 2016-06-04 DIAGNOSIS — I700 Atherosclerosis of aorta: Secondary | ICD-10-CM

## 2016-06-04 DIAGNOSIS — F0390 Unspecified dementia without behavioral disturbance: Secondary | ICD-10-CM

## 2016-06-04 DIAGNOSIS — C7A00 Malignant carcinoid tumor of unspecified site: Secondary | ICD-10-CM

## 2016-06-04 DIAGNOSIS — C7B02 Secondary carcinoid tumors of liver: Secondary | ICD-10-CM | POA: Diagnosis not present

## 2016-06-04 DIAGNOSIS — R5383 Other fatigue: Secondary | ICD-10-CM

## 2016-06-04 DIAGNOSIS — I10 Essential (primary) hypertension: Secondary | ICD-10-CM | POA: Diagnosis not present

## 2016-06-04 DIAGNOSIS — D696 Thrombocytopenia, unspecified: Secondary | ICD-10-CM

## 2016-06-04 DIAGNOSIS — Z90710 Acquired absence of both cervix and uterus: Secondary | ICD-10-CM

## 2016-06-04 DIAGNOSIS — R5381 Other malaise: Secondary | ICD-10-CM

## 2016-06-04 DIAGNOSIS — C7B09 Secondary carcinoid tumors of other sites: Secondary | ICD-10-CM | POA: Diagnosis not present

## 2016-06-04 DIAGNOSIS — K219 Gastro-esophageal reflux disease without esophagitis: Secondary | ICD-10-CM

## 2016-06-04 DIAGNOSIS — R634 Abnormal weight loss: Secondary | ICD-10-CM

## 2016-06-04 DIAGNOSIS — R531 Weakness: Secondary | ICD-10-CM

## 2016-12-03 ENCOUNTER — Ambulatory Visit
Admission: RE | Admit: 2016-12-03 | Discharge: 2016-12-03 | Disposition: A | Discharge status: Home / Self Care | Payer: Medicare Other | Source: Ambulatory Visit | Attending: Oncology | Admitting: Oncology

## 2016-12-03 DIAGNOSIS — I700 Atherosclerosis of aorta: Secondary | ICD-10-CM | POA: Insufficient documentation

## 2016-12-03 DIAGNOSIS — N200 Calculus of kidney: Secondary | ICD-10-CM | POA: Insufficient documentation

## 2016-12-03 DIAGNOSIS — C7A00 Malignant carcinoid tumor of unspecified site: Secondary | ICD-10-CM | POA: Insufficient documentation

## 2016-12-03 DIAGNOSIS — I058 Other rheumatic mitral valve diseases: Secondary | ICD-10-CM | POA: Diagnosis not present

## 2016-12-03 HISTORY — DX: Malignant (primary) neoplasm, unspecified: C80.1

## 2016-12-03 LAB — POCT I-STAT CREATININE: CREATININE: 0.7 mg/dL (ref 0.44–1.00)

## 2016-12-03 MED ORDER — IOPAMIDOL (ISOVUE-300) INJECTION 61%
100.0000 mL | Freq: Once | INTRAVENOUS | Status: AC | PRN
  Administered 2016-12-03: 100 mL via INTRAVENOUS

## 2016-12-08 NOTE — Progress Notes (Signed)
[facility]  Telephone:(336) [phone] Fax:(336) [phone]  ID: Coraleigh Sheeran OB: [birth year]  MR#: [id number]  NTI#:[id number]  Patient Care Team: Renata Caprice, DO as PCP - General (Family Medicine)  CHIEF COMPLAINT: Metastatic carcinoid tumor with lesions in stomach and liver diagnosed in approximately October 2012.  INTERVAL HISTORY: Patient returns to clinic today for further evaluation and discussion of her imaging results.  Review of systems is unobtainable secondary to ongoing dementia.  Her caretaker reports other than her declining memory and performance status there have been no new issues or complaints.    REVIEW OF SYSTEMS:   Review of Systems  Unable to perform ROS: Dementia    PAST MEDICAL HISTORY: Past Medical History:  Diagnosis Date  . Cancer ([place])   . GERD (gastroesophageal reflux disease)   . Hypertension   . Hypothyroidism     PAST SURGICAL HISTORY: Past Surgical History:  Procedure Laterality Date  . CHOLECYSTECTOMY    . HERNIA REPAIR    . KNEE SURGERY      FAMILY HISTORY: Reviewed and unchanged. No reported history of malignancy or chronic disease.     ADVANCED DIRECTIVES:    HEALTH MAINTENANCE: Social History  Substance Use Topics  . Smoking status: Not on file  . Smokeless tobacco: Not on file  . Alcohol use Not on file     Colonoscopy:  PAP:  Bone density:  Lipid panel:  Allergies  Allergen Reactions  . Iodinated Diagnostic Agents Hives, Itching and Rash    Current Outpatient Prescriptions  Medication Sig Dispense Refill  . acetaminophen (TYLENOL) 325 MG tablet Take 650 mg by mouth every 6 (six) hours as needed.    . furosemide (LASIX) 20 MG tablet Take 20 mg by mouth daily.    Marland Kitchen levothyroxine (SYNTHROID, LEVOTHROID) 100 MCG tablet Take 100 mcg by mouth daily before breakfast.     . loratadine (CLARITIN) 10 MG tablet Take 10 mg by mouth daily.    . metoprolol succinate (TOPROL-XL) 25 MG 24 hr tablet Take 12.5 mg  by mouth 2 (two) times daily.     . potassium chloride (K-DUR) 10 MEQ tablet Take 30 mEq by mouth daily.     . ranitidine (ZANTAC) 150 MG tablet Take 150 mg by mouth daily.     . sertraline (ZOLOFT) 50 MG tablet Take 50 mg by mouth daily.    . traMADol (ULTRAM) 50 MG tablet Take by mouth every 6 (six) hours as needed.    . Vitamin D, Ergocalciferol, (DRISDOL) 50000 units CAPS capsule Take 50,000 Units by mouth every 7 (seven) days.    Marland Kitchen lisinopril (PRINIVIL,ZESTRIL) 10 MG tablet Take 10 mg by mouth daily.    Marland Kitchen omeprazole (PRILOSEC) 20 MG capsule Take 20 mg by mouth daily.    . predniSONE (DELTASONE) 50 MG tablet      No current facility-administered medications for this visit.     OBJECTIVE: Vitals:   12/10/16 1119  BP: (!) 167/82  Pulse: 73  Resp: 18  Temp: 97.9 F (36.6 C)     There is no height or weight on file to calculate BMI.    ECOG FS:3 - Symptomatic, >50% confined to bed  General: no acute distress. Eyes: Pink conjunctiva, anicteric sclera. Lungs: Clear to auscultation bilaterally. Heart: Regular rate and rhythm. No rubs, murmurs, or gallops. Abdomen: Soft, nontender, nondistended. No organomegaly noted, normoactive bowel sounds. Musculoskeletal: No edema, cyanosis, or clubbing. Neuro: Confused. Cranial nerves grossly intact. Skin: No rashes or petechiae  noted.   LAB RESULTS:  Lab Results  Component Value Date   NA 138 05/17/2016   K 3.7 05/17/2016   CL 104 05/17/2016   CO2 31 05/17/2016   GLUCOSE 85 05/17/2016   BUN 9 05/17/2016   CREATININE 0.70 12/03/2016   CALCIUM 9.4 05/17/2016   PROT 7.5 05/17/2016   ALBUMIN 3.9 05/17/2016   AST 14 (L) 05/17/2016   ALT 8 (L) 05/17/2016   ALKPHOS 67 05/17/2016   BILITOT 2.0 (H) 05/17/2016   GFRNONAA >60 05/17/2016   GFRAA >60 05/17/2016    Lab Results  Component Value Date   WBC 3.3 (L) 05/17/2016   NEUTROABS 1.5 05/17/2016   HGB 13.0 05/17/2016   HCT 39.8 05/17/2016   MCV 88.7 05/17/2016   PLT 84 (L)  05/17/2016     STUDIES: Ct Abdomen Pelvis W Contrast  Result Date: 12/03/2016 CLINICAL DATA:  Metastatic carcinoid tumor EXAM: CT ABDOMEN AND PELVIS WITH CONTRAST TECHNIQUE: Multidetector CT imaging of the abdomen and pelvis was performed using the standard protocol following bolus administration of intravenous contrast. CONTRAST:  123mL ISOVUE-300 IOPAMIDOL (ISOVUE-300) INJECTION 61% COMPARISON:  06/03/2016 FINDINGS: Lower chest: Mosaic attenuation pattern within the lung bases is again noted. Trace bilateral pleural effusions noted. Calcifications within the mitral valve identified. LAD coronary artery calcifications also noted. Hepatobiliary: No focal liver abnormality. No intrahepatic biliary dilatation. Previous cholecystectomy. Pancreas: Unremarkable. No pancreatic ductal dilatation or surrounding inflammatory changes. Spleen: Normal in size without focal abnormality. Adrenals/Urinary Tract: The adrenal glands are normal. Right kidney stone measures 7 mm. Punctate calcification within the inferior pole the left kidney is noted. Bilateral renal cysts noted. No hydronephrosis or mass. Urinary bladder appears normal. Stomach/Bowel: The stomach is normal. The small bowel loops are nondilated. The appendix is visualized and appears normal. No pathologic dilatation of the colon. Vascular/Lymphatic: Aortic atherosclerosis. No aneurysm. Partially calcified mass within the gastrohepatic ligament measures 5.4 by 4.6 cm, image 25 of series 2. Previously 5.2 x 4.1 cm. No additional adenopathy identified within the abdomen or pelvis. Reproductive: Status post hysterectomy. No adnexal masses. Other: Postoperative changes of mesh repair for ventral abdominal wall hernia in the infraumbilical region. No free fluid or fluid collections within the abdomen or pelvis. Musculoskeletal: No acute or significant osseous findings. Stable compression fracture involving the L2 vertebra. No aggressive lytic or sclerotic bone  lesions. IMPRESSION: 1. Slight interval growth of partially calcified nodal mass in the gastrohepatic ligament. 2. No additional sites of metastatic disease noted elsewhere in the abdomen or pelvis. 3.  Aortic Atherosclerosis (ICD10-I70.0). 4. Nonobstructing renal calculi. 5. Mitral valve calcifications. Electronically Signed   By: Kerby Moors M.D.   On: 12/03/2016 13:55    ASSESSMENT: Metastatic carcinoid tumor with lesions in stomach and liver, unknown if high-grade or low-grade.  PLAN:    1.  Metastatic carcinoid tumor with lesions in stomach and liver, unknown if high-grade or low-grade: CT scan results from December 03, 2016 reviewed independently with slight progression of disease. Patient's chromogranin A levels are within normal limits, today's result is pending. Given her worsening dementia and slow growth of her tumor, it was decided upon that no further imaging or treatment would be necessary.  No further follow-up has been scheduled. 2. Diarrhea: Patient does not complain of this today. Chromogranin A as well as 24-hour 5 HIAA urine are within normal limits. Continue treatment with Imodium as needed. 3. Weight loss: Mild, monitor. 4. Thrombocytopenia: Unchanged, monitor. 5. Hyperbilirubinemia: Unchanged. Unclear etiology, monitor.  Patient  expressed understanding and was in agreement with this plan. She also understands that She can call clinic at any time with any questions, concerns, or complaints.    Lloyd Huger, MD   12/10/2016 2:14 PM

## 2016-12-10 ENCOUNTER — Inpatient Hospital Stay: Payer: Medicare Other | Attending: Oncology | Admitting: Oncology

## 2016-12-10 VITALS — BP 167/82 | HR 73 | Temp 97.9°F | Resp 18 | Wt 177.4 lb

## 2016-12-10 DIAGNOSIS — R634 Abnormal weight loss: Secondary | ICD-10-CM | POA: Diagnosis not present

## 2016-12-10 DIAGNOSIS — Z90710 Acquired absence of both cervix and uterus: Secondary | ICD-10-CM | POA: Diagnosis not present

## 2016-12-10 DIAGNOSIS — E039 Hypothyroidism, unspecified: Secondary | ICD-10-CM | POA: Insufficient documentation

## 2016-12-10 DIAGNOSIS — Z7952 Long term (current) use of systemic steroids: Secondary | ICD-10-CM | POA: Diagnosis not present

## 2016-12-10 DIAGNOSIS — I10 Essential (primary) hypertension: Secondary | ICD-10-CM

## 2016-12-10 DIAGNOSIS — C7B09 Secondary carcinoid tumors of other sites: Secondary | ICD-10-CM | POA: Diagnosis not present

## 2016-12-10 DIAGNOSIS — I700 Atherosclerosis of aorta: Secondary | ICD-10-CM | POA: Diagnosis not present

## 2016-12-10 DIAGNOSIS — Z9049 Acquired absence of other specified parts of digestive tract: Secondary | ICD-10-CM | POA: Insufficient documentation

## 2016-12-10 DIAGNOSIS — N200 Calculus of kidney: Secondary | ICD-10-CM | POA: Diagnosis not present

## 2016-12-10 DIAGNOSIS — Z79899 Other long term (current) drug therapy: Secondary | ICD-10-CM | POA: Diagnosis not present

## 2016-12-10 DIAGNOSIS — C7B02 Secondary carcinoid tumors of liver: Secondary | ICD-10-CM | POA: Diagnosis not present

## 2016-12-10 DIAGNOSIS — D696 Thrombocytopenia, unspecified: Secondary | ICD-10-CM

## 2016-12-10 DIAGNOSIS — K219 Gastro-esophageal reflux disease without esophagitis: Secondary | ICD-10-CM | POA: Insufficient documentation

## 2016-12-10 DIAGNOSIS — C7A00 Malignant carcinoid tumor of unspecified site: Secondary | ICD-10-CM | POA: Diagnosis present

## 2016-12-10 DIAGNOSIS — F0390 Unspecified dementia without behavioral disturbance: Secondary | ICD-10-CM | POA: Diagnosis not present

## 2017-08-10 IMAGING — CT CT ABD-PELV W/ CM
2 of 5 series · 15 of 46 positions shown, 17 images · IV contrast (iopamidol)
Comparison: CT 04/23/2015 and 10/15/2014

CLINICAL DATA: Restaging malignant carcinoid tumor of unknown
origin. Previous cholecystectomy and hernia repair. contrast
allergy.

EXAM:
CT ABDOMEN AND PELVIS WITH CONTRAST
TECHNIQUE: Multidetector CT imaging of the abdomen and pelvis was performed
using the standard protocol following bolus administration of
intravenous contrast.
CONTRAST:  100mL [id number] IOPAMIDOL ([id number]) INJECTION 61%.
The patient was premedicated according to the 49hour steroid and
Benadryl prep due to a history of iodinated contrast allergy. There
were no immediate complications.

[Series 2: axial soft tissue · axial · 0.73mm/px · z∈[-866,-441]mm · 12 of 95 slices shown, 14 images]
[im 5/95  soft-tissue]
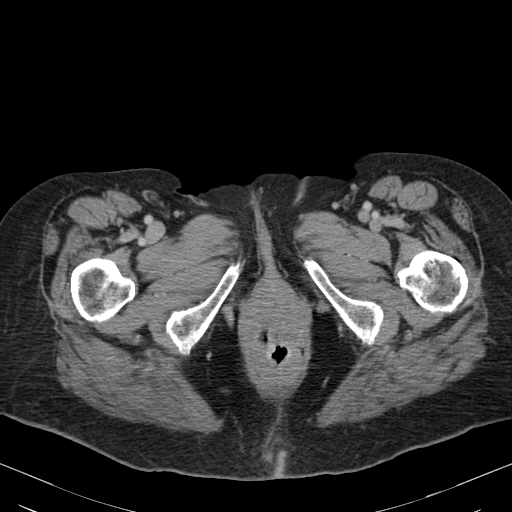
[im 5/95  bone]
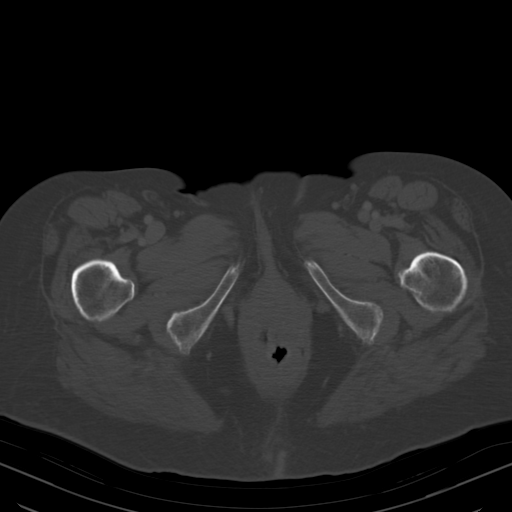
[im 15/95  soft-tissue]
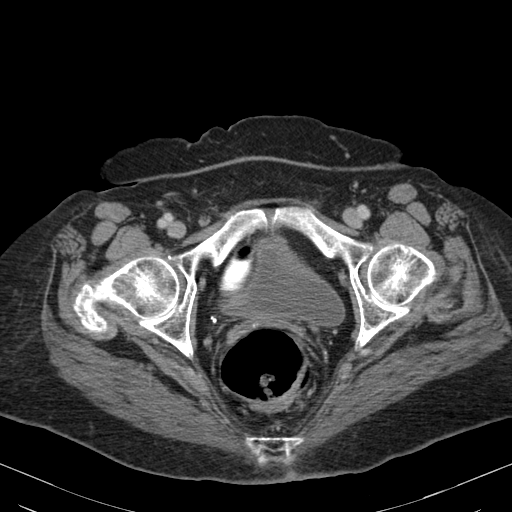
[im 20/95  soft-tissue]
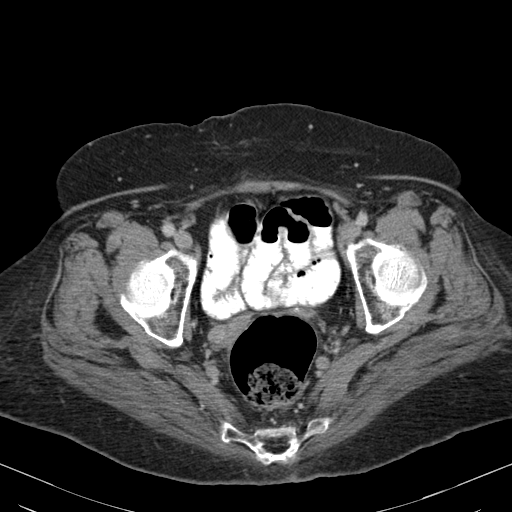
[im 30/95  soft-tissue]
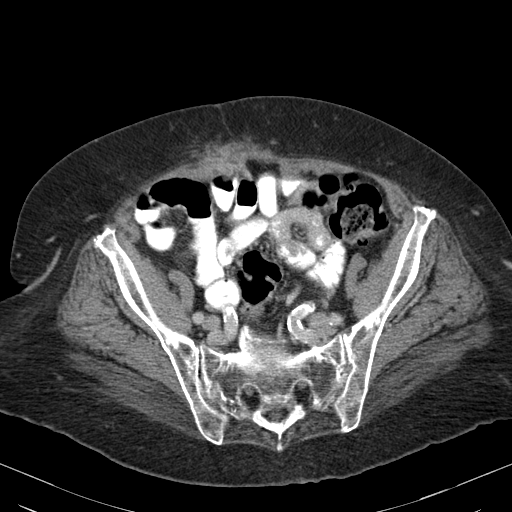
[im 35/95  soft-tissue]
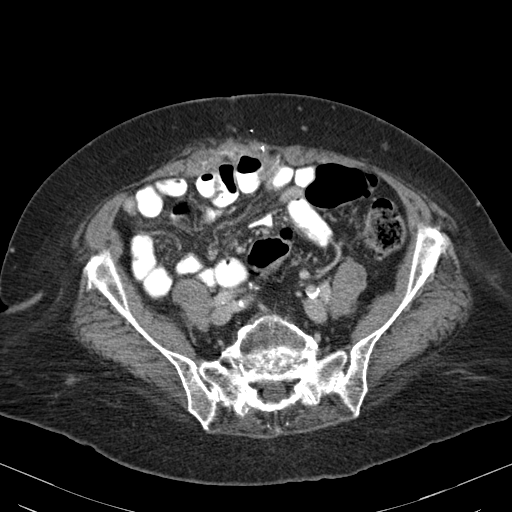
[im 45/95  soft-tissue]
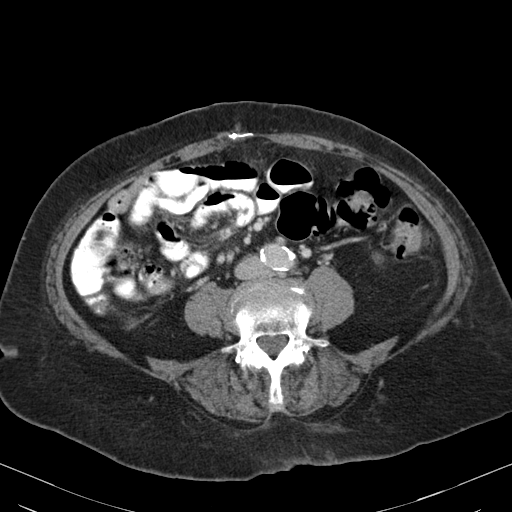
[im 50/95  soft-tissue]
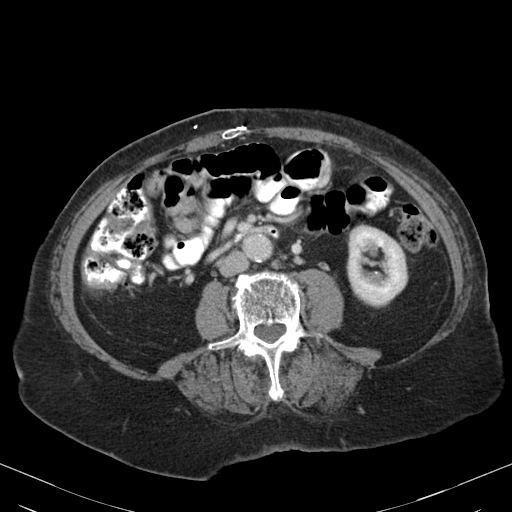
[im 60/95  soft-tissue]
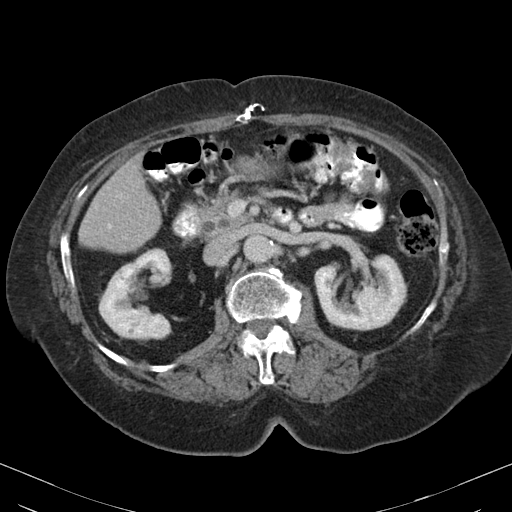
[im 65/95  soft-tissue]
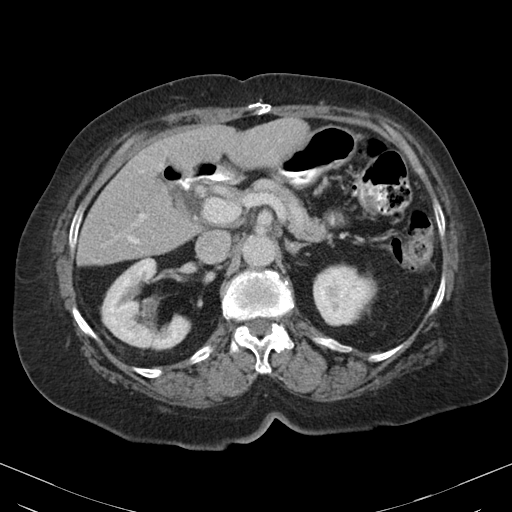
[im 65/95  bone]
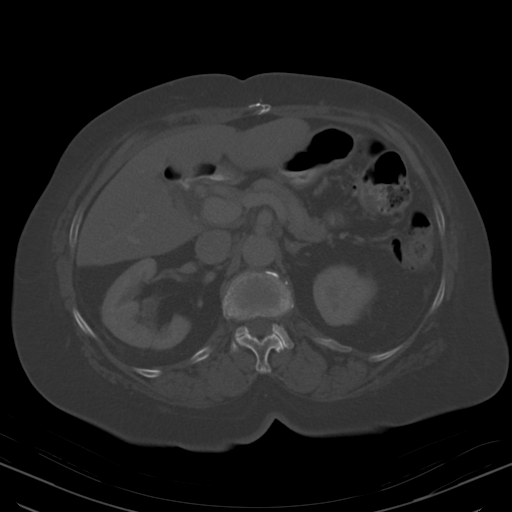
[im 75/95  soft-tissue]
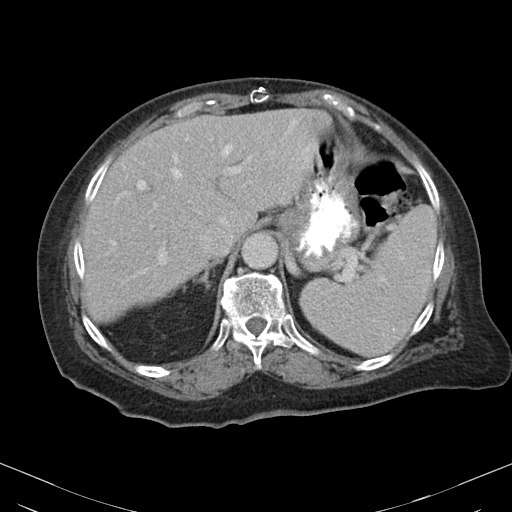
[im 80/95  soft-tissue]
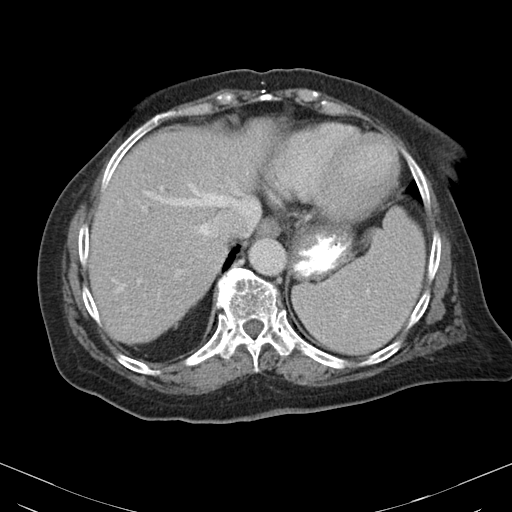
[im 90/95  soft-tissue]
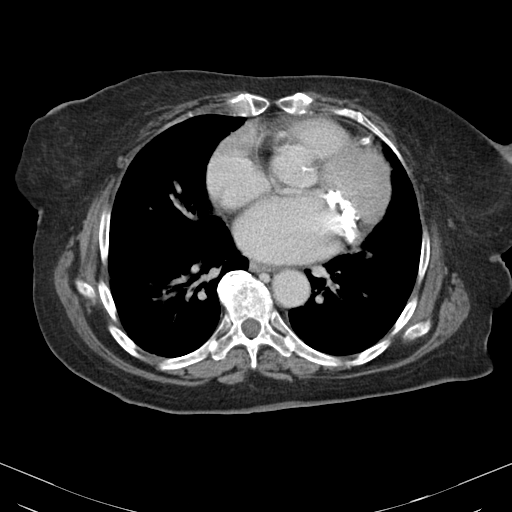

[Series 602: coronal · coronal · 0.93mm/px · 3 of 96 slices shown]
[im 32/96  soft-tissue]
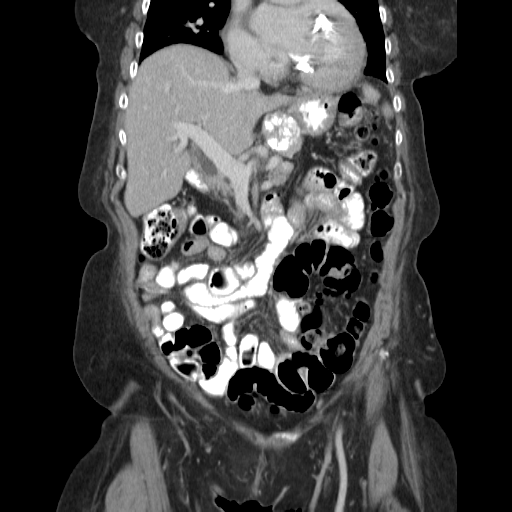
[im 43/96  soft-tissue]
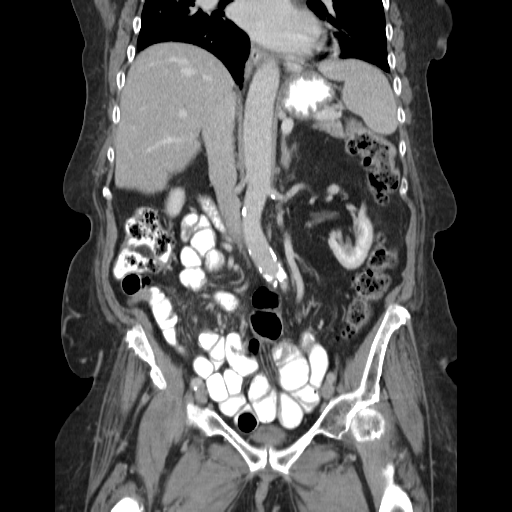
[im 53/96  soft-tissue]
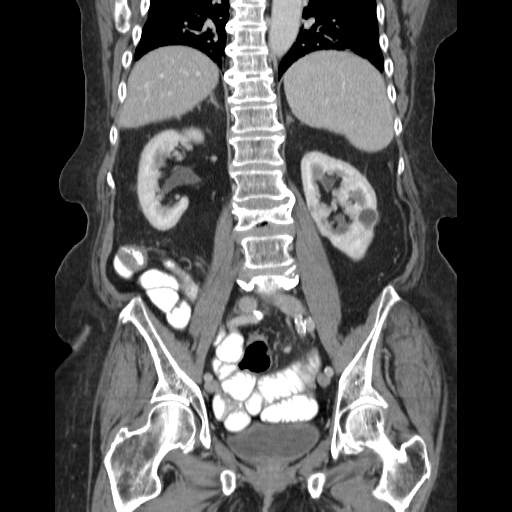

[15 of 46 positions shown; findings below may reference images not displayed]

FINDINGS: Lower chest: The visualized lung bases are stable with patchy
ground-glass attenuation. No significant pleural or pericardial
effusion. The heart is enlarged. There are prominent calcifications
of the mitral annulus as well as coronary artery atherosclerosis.

Hepatobiliary: The liver is normal in density without focal
abnormality. No hypervascular liver lesions are identified. There is
stable mild biliary dilatation status post cholecystectomy. The
common bile duct measures up to 11 mm in diameter.

Pancreas: The pancreas appears stable with mild prominence of the
main pancreatic duct. No evidence of pancreatic mass, surrounding
fluid collection or inflammatory change.

Spleen: Normal in size without focal abnormality.

Adrenals/Urinary Tract: Both adrenal glands appear normal. Multiple
nonobstructing right renal calculi are again noted, including 1
within a chronically dilated upper pole calyx. There is no evidence
of ureteral or bladder calculus. There are stable cystic lesions in
both kidneys. No evidence of enhancing renal mass. Mild prominence
of the bladder wall attributed to incomplete distention.

Stomach/Bowel: No evidence of bowel wall thickening, distention or
surrounding inflammatory change. Stable mild diverticular changes of
the sigmoid colon. There is prominent stool throughout the colon.
The appendix appears normal.

Vascular/Lymphatic: Calcified mass involving the gastrohepatic
ligament measures 4.1 x 3.7 cm on image 25 which is not
significantly changed from the prior study (remeasured
orthogonally). No new or enlarging abdominal lymph nodes. There is
atherosclerosis of the aorta and its branches without evidence of
large vessel occlusion.

Reproductive: Hysterectomy.  No evidence of adnexal mass.

Other: Stable postsurgical changes in the anterior abdominal wall.
No evidence of ascites or peritoneal nodularity.

Musculoskeletal: No acute or significant osseous findings. Stable
chronic L2 compression fracture, grade 1 anterolisthesis at L4-5 and
spondylosis.
IMPRESSION: 1. Stable examination with stable calcified mass in the
gastrohepatic ligament. No evidence of disease progression.
2. Stable nonobstructing right renal calculi, dilated calyx in the
upper pole of the right kidney and bilateral renal cysts.
3.  Aortic Atherosclerosis ([id number])
4. To potentially avoid the necessity of administering a contrast
allergy prep in the future, consider MR follow-up (which may be more
sensitive for hepatic disease as well).

## 2018-01-15 DEATH — deceased
